# Patient Record
Sex: Male | Born: 1960 | Race: White | Hispanic: No | Marital: Married | Smoking: Never smoker
Health system: Southern US, Community
[De-identification: ages and names within clinical notes are randomized; demographics above are authoritative.]

## PROBLEM LIST (undated history)

## (undated) DIAGNOSIS — G4730 Sleep apnea, unspecified: Secondary | ICD-10-CM

## (undated) DIAGNOSIS — F419 Anxiety disorder, unspecified: Secondary | ICD-10-CM

## (undated) DIAGNOSIS — M1990 Unspecified osteoarthritis, unspecified site: Secondary | ICD-10-CM

## (undated) DIAGNOSIS — K219 Gastro-esophageal reflux disease without esophagitis: Secondary | ICD-10-CM

## (undated) DIAGNOSIS — J189 Pneumonia, unspecified organism: Secondary | ICD-10-CM

## (undated) DIAGNOSIS — F329 Major depressive disorder, single episode, unspecified: Secondary | ICD-10-CM

## (undated) DIAGNOSIS — F32A Depression, unspecified: Secondary | ICD-10-CM

## (undated) HISTORY — PX: UVULOPALATOPHARYNGOPLASTY: SHX827

## (undated) HISTORY — PX: FRACTURE SURGERY: SHX138

## (undated) HISTORY — PX: EXPLORATION POST OPERATIVE OPEN HEART: SHX5061

---

## 2018-01-27 DIAGNOSIS — K603 Anal fistula: Secondary | ICD-10-CM | POA: Insufficient documentation

## 2018-08-26 ENCOUNTER — Other Ambulatory Visit (HOSPITAL_COMMUNITY): Payer: Self-pay | Admitting: Orthopedic Surgery

## 2018-08-26 DIAGNOSIS — M25551 Pain in right hip: Secondary | ICD-10-CM

## 2018-08-26 DIAGNOSIS — M79604 Pain in right leg: Secondary | ICD-10-CM

## 2018-09-22 ENCOUNTER — Encounter (HOSPITAL_COMMUNITY): Payer: Self-pay | Admitting: *Deleted

## 2018-09-22 ENCOUNTER — Other Ambulatory Visit: Payer: Self-pay

## 2018-09-22 NOTE — Progress Notes (Signed)
Spoke with pt for pre-call. Pt denies cardiac history, HTN or Diabetes. Pt has had surgery for sleep apnea, does not use a Cpap.  Pt's PCP is Dr. Elvina Mattes at [facility] in [place]

## 2018-09-23 ENCOUNTER — Encounter (HOSPITAL_COMMUNITY): Payer: Self-pay | Admitting: *Deleted

## 2018-09-23 ENCOUNTER — Ambulatory Visit (HOSPITAL_COMMUNITY): Payer: BLUE CROSS/BLUE SHIELD | Admitting: Certified Registered"

## 2018-09-23 ENCOUNTER — Ambulatory Visit (HOSPITAL_COMMUNITY)
Admission: RE | Admit: 2018-09-23 | Discharge: 2018-09-23 | Disposition: A | Discharge status: Home / Self Care | Payer: BLUE CROSS/BLUE SHIELD | Source: Ambulatory Visit | Attending: Orthopedic Surgery | Admitting: Orthopedic Surgery

## 2018-09-23 ENCOUNTER — Encounter (HOSPITAL_COMMUNITY)
Admission: RE | Disposition: A | Discharge status: Home / Self Care | Payer: Self-pay | Source: Ambulatory Visit | Attending: Orthopedic Surgery

## 2018-09-23 DIAGNOSIS — M25551 Pain in right hip: Secondary | ICD-10-CM | POA: Insufficient documentation

## 2018-09-23 DIAGNOSIS — S73191A Other sprain of right hip, initial encounter: Secondary | ICD-10-CM | POA: Insufficient documentation

## 2018-09-23 DIAGNOSIS — X58XXXA Exposure to other specified factors, initial encounter: Secondary | ICD-10-CM | POA: Insufficient documentation

## 2018-09-23 DIAGNOSIS — M1612 Unilateral primary osteoarthritis, left hip: Secondary | ICD-10-CM | POA: Insufficient documentation

## 2018-09-23 DIAGNOSIS — M79604 Pain in right leg: Secondary | ICD-10-CM | POA: Insufficient documentation

## 2018-09-23 HISTORY — DX: Sleep apnea, unspecified: G47.30

## 2018-09-23 HISTORY — DX: Anxiety disorder, unspecified: F41.9

## 2018-09-23 HISTORY — DX: Depression, unspecified: F32.A

## 2018-09-23 HISTORY — DX: Unspecified osteoarthritis, unspecified site: M19.90

## 2018-09-23 HISTORY — DX: Gastro-esophageal reflux disease without esophagitis: K21.9

## 2018-09-23 HISTORY — PX: RADIOLOGY WITH ANESTHESIA: SHX6223

## 2018-09-23 HISTORY — DX: Pneumonia, unspecified organism: J18.9

## 2018-09-23 HISTORY — DX: Major depressive disorder, single episode, unspecified: F32.9

## 2018-09-23 SURGERY — MRI WITH ANESTHESIA
Anesthesia: General | Laterality: Right

## 2018-09-23 MED ORDER — SODIUM CHLORIDE 0.9 % IV SOLN
INTRAVENOUS | Status: DC | PRN
  Administered 2018-09-23: 09:00:00 via INTRAVENOUS

## 2018-09-23 MED ORDER — ONDANSETRON HCL 4 MG/2ML IJ SOLN
INTRAMUSCULAR | Status: DC | PRN
  Administered 2018-09-23: 4 mg via INTRAVENOUS

## 2018-09-23 MED ORDER — FENTANYL CITRATE (PF) 100 MCG/2ML IJ SOLN: 25.0000 ug | INTRAMUSCULAR | Status: DC | PRN

## 2018-09-23 MED ORDER — MIDAZOLAM HCL 2 MG/2ML IJ SOLN
INTRAMUSCULAR | Status: DC | PRN
  Administered 2018-09-23: 2 mg via INTRAVENOUS

## 2018-09-23 MED ORDER — FENTANYL CITRATE (PF) 100 MCG/2ML IJ SOLN
INTRAMUSCULAR | Status: DC | PRN
  Administered 2018-09-23: 100 ug via INTRAVENOUS

## 2018-09-23 MED ORDER — ONDANSETRON HCL 4 MG/2ML IJ SOLN: 4.0000 mg | Freq: Once | INTRAMUSCULAR | Status: DC | PRN

## 2018-09-23 MED ORDER — PROPOFOL 10 MG/ML IV BOLUS
INTRAVENOUS | Status: DC | PRN
  Administered 2018-09-23: 200 mg via INTRAVENOUS

## 2018-09-23 MED ORDER — DEXAMETHASONE SODIUM PHOSPHATE 10 MG/ML IJ SOLN
INTRAMUSCULAR | Status: DC | PRN
  Administered 2018-09-23: 4 mg via INTRAVENOUS

## 2018-09-23 MED ORDER — LACTATED RINGERS IV SOLN
INTRAVENOUS | Status: DC
  Administered 2018-09-23: 08:00:00 via INTRAVENOUS

## 2018-09-23 MED ORDER — LIDOCAINE 2% (20 MG/ML) 5 ML SYRINGE
INTRAMUSCULAR | Status: DC | PRN
  Administered 2018-09-23: 100 mg via INTRAVENOUS

## 2018-09-23 NOTE — Progress Notes (Signed)
Spoke with Dr. Bradley Ferris on the phone regarding bloodwork he wanted for MRI procedure.  Stated no bloodwork needed.

## 2018-09-23 NOTE — H&P (Signed)
Anesthesia H&P Update: History and Physical Exam reviewed; patient is OK for planned anesthetic and procedure. ? ?

## 2018-09-23 NOTE — Transfer of Care (Signed)
Immediate Anesthesia Transfer of Care Note  Patient: Scott Barber  Procedure(s) Performed: MRI WITH ANESTHESIA OF RIGHT HIP WITHOUT CONTRAST (Right )  Patient Location: PACU  Anesthesia Type:General  Level of Consciousness: awake, alert , oriented and patient cooperative  Airway & Oxygen Therapy: Patient Spontanous Breathing and Patient connected to nasal cannula oxygen  Post-op Assessment: Report given to RN and Post -op Vital signs reviewed and stable  Post vital signs: Reviewed and stable  Last Vitals:  Vitals Value Taken Time  BP    Temp    Pulse 90 09/23/2018 10:56 AM  Resp 12 09/23/2018 10:56 AM  SpO2 97 % 09/23/2018 10:56 AM  Vitals shown include unvalidated device data.  Last Pain:  Vitals:   09/23/18 0809  TempSrc:   PainSc: 5       Patients Stated Pain Goal: 7 (09/23/18 0809)  Complications: No apparent anesthesia complications

## 2018-09-23 NOTE — Anesthesia Postprocedure Evaluation (Signed)
Anesthesia Post Note  Patient: Scott Barber  Procedure(s) Performed: MRI WITH ANESTHESIA OF RIGHT HIP WITHOUT CONTRAST (Right )     Patient location during evaluation: PACU Anesthesia Type: General Level of consciousness: awake and alert Pain management: pain level controlled Vital Signs Assessment: post-procedure vital signs reviewed and stable Respiratory status: spontaneous breathing, nonlabored ventilation, respiratory function stable and patient connected to nasal cannula oxygen Cardiovascular status: blood pressure returned to baseline and stable Postop Assessment: no apparent nausea or vomiting Anesthetic complications: no    Last Vitals:  Vitals:   09/23/18 1130 09/23/18 1133  BP:    Pulse: 86 87  Resp: 15 14  Temp: 36.9 C   SpO2: 95% 94%    Last Pain:  Vitals:   09/23/18 1101  TempSrc:   PainSc: 0-No pain                 Khiana Camino P Amrit Cress

## 2018-09-23 NOTE — Anesthesia Preprocedure Evaluation (Addendum)
Anesthesia Evaluation  Patient identified by MRN, date of birth, ID band Patient awake    Reviewed: Allergy & Precautions, NPO status , Patient's Chart, lab work & pertinent test results  Airway Mallampati: II  TM Distance: >3 FB Neck ROM: Full    Dental no notable dental hx.    Pulmonary sleep apnea and Continuous Positive Airway Pressure Ventilation ,    Pulmonary exam normal breath sounds clear to auscultation       Cardiovascular negative cardio ROS Normal cardiovascular exam Rhythm:Regular Rate:Normal     Neuro/Psych PSYCHIATRIC DISORDERS Anxiety Depression negative neurological ROS     GI/Hepatic Neg liver ROS, GERD  Medicated and Controlled,  Endo/Other  negative endocrine ROS  Renal/GU negative Renal ROS     Musculoskeletal negative musculoskeletal ROS (+)   Abdominal (+) + obese,   Peds  Hematology negative hematology ROS (+)   Anesthesia Other Findings RIGHT HIP AND LEG PAIN  Reproductive/Obstetrics                            Anesthesia Physical Anesthesia Plan  ASA: II  Anesthesia Plan: General   Post-op Pain Management:    Induction: Intravenous  PONV Risk Score and Plan: 2 and Ondansetron, Dexamethasone, Midazolam and Treatment may vary due to age or medical condition  Airway Management Planned: LMA  Additional Equipment:   Intra-op Plan:   Post-operative Plan: Extubation in OR  Informed Consent: I have reviewed the patients History and Physical, chart, labs and discussed the procedure including the risks, benefits and alternatives for the proposed anesthesia with the patient or authorized representative who has indicated his/her understanding and acceptance.   Dental advisory given  Plan Discussed with: CRNA  Anesthesia Plan Comments:         Anesthesia Quick Evaluation

## 2018-09-24 ENCOUNTER — Encounter (HOSPITAL_COMMUNITY): Payer: Self-pay | Admitting: Radiology

## 2018-10-17 ENCOUNTER — Ambulatory Visit (INDEPENDENT_AMBULATORY_CARE_PROVIDER_SITE_OTHER): Payer: BLUE CROSS/BLUE SHIELD | Admitting: Pulmonary Disease

## 2018-10-17 ENCOUNTER — Encounter: Payer: Self-pay | Admitting: Pulmonary Disease

## 2018-10-17 VITALS — BP 130/90 | HR 75 | Ht 77.0 in | Wt 285.8 lb

## 2018-10-17 DIAGNOSIS — G4733 Obstructive sleep apnea (adult) (pediatric): Secondary | ICD-10-CM

## 2018-10-17 NOTE — Progress Notes (Signed)
Scott Barber    [id number]    [birth year]  Primary Care Physician:Fulk, Lacie Draft, MD  Referring Physician: Alease Medina, MD [address] [place], [place] [address]  Chief complaint:   Patient with a history of obstructive sleep apnea, status post UPPP Had a study about 10 years ago that was negative Being seen for preoperative evaluation  HPI:  Patient with chronic hip pain for which he is having surgery Diagnosed with obstructive sleep apnea over 20 years ago Did have UPPP With improvement in symptoms He still uses CPAP currently for snoring Weight has been about the same-stable No significant daytime symptoms Sleep in the recent times have been disrupted by having a lot of pain and discomfort  Usually goes to bed between 930 and 10:30 PM, takes him about 30 minutes to fall asleep, gets out of bed between 6 and 7 AM   Outpatient Encounter Medications as of 10/17/2018  Medication Sig  . amphetamine-dextroamphetamine (ADDERALL) 20 MG tablet Take 20 mg by mouth 3 (three) times daily.  . busPIRone (BUSPAR) 15 MG tablet Take 15 mg by mouth 2 (two) times daily.  . cetirizine (ZYRTEC) 10 MG tablet Take 10 mg by mouth every evening.  . citalopram (CELEXA) 20 MG tablet Take 20 mg by mouth at bedtime.  . diclofenac (VOLTAREN) 75 MG EC tablet Take 75 mg by mouth 2 (two) times daily.  . Menthol-Methyl Salicylate (MUSCLE RUB EX) Apply 1 application topically daily as needed (pain). "Stop-pain" roll on  . omeprazole (PRILOSEC OTC) 20 MG tablet Take 20 mg by mouth daily.  Marland Kitchen oxyCODONE-acetaminophen (PERCOCET/ROXICET) 5-325 MG tablet Take 1 tablet by mouth every 6 (six) hours as needed for severe pain.  Marland Kitchen venlafaxine XR (EFFEXOR-XR) 150 MG 24 hr capsule Take 300 mg by mouth daily.  Marland Kitchen gabapentin (NEURONTIN) 100 MG capsule TK 1 C PO TID PRN   No facility-administered encounter medications on file as of 10/17/2018.     Allergies as of 10/17/2018  . (No Known Allergies)     Past Medical History:  Diagnosis Date  . Anxiety   . Arthritis   . Depression   . GERD (gastroesophageal reflux disease)   . Pneumonia   . Sleep apnea    uses cpap    Past Surgical History:  Procedure Laterality Date  . FRACTURE SURGERY Left    femur fracture  . RADIOLOGY WITH ANESTHESIA Right 09/23/2018   Procedure: MRI WITH ANESTHESIA OF RIGHT HIP WITHOUT CONTRAST;  Surgeon: Radiologist, Medication, MD;  Location: [facility] OR;  Service: Radiology;  Laterality: Right;  . UVULOPALATOPHARYNGOPLASTY      Family History  Problem Relation Age of Onset  . Cancer Mother   . Dementia Father     Social History   Socioeconomic History  . Marital status: Married    Spouse name: Not on file  . Number of children: Not on file  . Years of education: Not on file  . Highest education level: Not on file  Occupational History  . Not on file  Social Needs  . Financial resource strain: Not on file  . Food insecurity:    Worry: Not on file    Inability: Not on file  . Transportation needs:    Medical: Not on file    Non-medical: Not on file  Tobacco Use  . Smoking status: Never Smoker  . Smokeless tobacco: Never Used  Substance and Sexual Activity  . Alcohol use:  Yes    Comment: rare  . Drug use: Never  . Sexual activity: Not on file  Lifestyle  . Physical activity:    Days per week: Not on file    Minutes per session: Not on file  . Stress: Not on file  Relationships  . Social connections:    Talks on phone: Not on file    Gets together: Not on file    Attends religious service: Not on file    Active member of club or organization: Not on file    Attends meetings of clubs or organizations: Not on file    Relationship status: Not on file  . Intimate partner violence:    Fear of current or ex partner: Not on file    Emotionally abused: Not on file    Physically abused: Not on file    Forced sexual activity: Not on file  Other Topics Concern  . Not on file  Social  History Narrative  . Not on file    Review of Systems  Constitutional: Negative.   HENT: Negative.   Respiratory: Positive for apnea. Negative for shortness of breath and wheezing.   Gastrointestinal: Negative.   Genitourinary: Negative.   Musculoskeletal: Negative.   Skin: Negative.   Psychiatric/Behavioral: Positive for sleep disturbance.    Vitals:   10/17/18 1127  BP: 130/90  Pulse: 75  SpO2: 100%     Physical Exam  Constitutional: He is oriented to person, place, and time. He appears well-developed and well-nourished. No distress.  HENT:  Head: Normocephalic.  Mallampati 3  Eyes: Pupils are equal, round, and reactive to light. Conjunctivae and EOM are normal. Right eye exhibits no discharge. Left eye exhibits no discharge.  Neck: Normal range of motion. Neck supple. No tracheal deviation present. No thyromegaly present.  Cardiovascular: Normal rate, regular rhythm and normal heart sounds.  Pulmonary/Chest: Effort normal and breath sounds normal. No respiratory distress.  Abdominal: Soft. Bowel sounds are normal. He exhibits no distension. There is no tenderness.  Musculoskeletal: Normal range of motion. He exhibits no edema.  Neurological: He is alert and oriented to person, place, and time. He has normal reflexes. No cranial nerve deficit.  Skin: Skin is warm and dry. He is not diaphoretic. No erythema.  Psychiatric: He has a normal mood and affect.   Assessment:  Chronic hip pain from surgery  History of obstructive sleep apnea following which he had UPPP  Had a sleep study many years back showing resolution of his sleep disordered breathing however continues to use CPAP for snoring-tolerated well  He may still have obstructive sleep apnea however, not very symptomatic at present  Plan/Recommendations:  He is clear for surgery  Agree with patient bringing in his CPAP device for perioperative recovery  Minimal time for surgery, extubation and semi-recumbent  position, may be extubated to CPAP-which he tolerates well  When he is recovered from his surgery, we can obtain a repeat home sleep study to ascertain whether he still has significant sleep disordered breathing  He has no preclusion to going through with surgery at present  Virl Diamond MD [facility] 10/17/2018, 11:53 AM  CC: Alease Medina, MD

## 2018-10-17 NOTE — Patient Instructions (Signed)
Clear for surgery  Agree with having your CPAP available for perioperative recovery  We will obtain a home sleep study when you are fully recovered to assess for presence of sleep disordered breathing  I will tentatively see you back in the office in about 3 months

## 2019-01-25 DIAGNOSIS — Z8669 Personal history of other diseases of the nervous system and sense organs: Secondary | ICD-10-CM | POA: Insufficient documentation

## 2019-01-25 DIAGNOSIS — M5412 Radiculopathy, cervical region: Secondary | ICD-10-CM | POA: Insufficient documentation

## 2019-01-25 DIAGNOSIS — F32A Depression, unspecified: Secondary | ICD-10-CM | POA: Insufficient documentation

## 2019-01-25 DIAGNOSIS — R0683 Snoring: Secondary | ICD-10-CM | POA: Insufficient documentation

## 2019-02-03 ENCOUNTER — Telehealth: Payer: Self-pay | Admitting: Pulmonary Disease

## 2019-02-03 NOTE — Telephone Encounter (Signed)
-----   Message from Lilian Kapur sent at 02/03/2019  3:10 PM EST ----- Regarding: HST I have been trying to get this patient setup to do the HST since you placed the order on 10/17/2018. Seems like he has a lot going on with his health. When I spoke with him today I ask him if he would like for me to put the order back in our stack and when he was ready to do the HST he could call and contact the office to schedule at that time.  Thanks Synetta Fail

## 2019-02-03 NOTE — Telephone Encounter (Signed)
Will see the patient has follow-up  Should call whenever he wants his study rescheduled for

## 2019-02-16 ENCOUNTER — Telehealth: Payer: Self-pay | Admitting: Pulmonary Disease

## 2019-02-16 NOTE — Telephone Encounter (Signed)
Unable to schedule HST with Patient at this time °

## 2019-02-16 NOTE — Telephone Encounter (Signed)
-----   Message from Tomma Lightning, MD sent at 02/03/2019  3:35 PM EST ----- Regarding: FW: HST Can be rescheduled for whenever he is ready  Will follow him up as outpatient ----- Message ----- From: Lilian Kapur Sent: 02/03/2019   3:10 PM EST To: Tomma Lightning, MD Subject: HST                                            I have been trying to get this patient setup to do the HST since you placed the order on 10/17/2018. Seems like he has a lot going on with his health. When I spoke with him today I ask him if he would like for me to put the order back in our stack and when he was ready to do the HST he could call and contact the office to schedule at that time.  Thanks Synetta Fail

## 2019-10-02 DIAGNOSIS — T148XXA Other injury of unspecified body region, initial encounter: Secondary | ICD-10-CM | POA: Insufficient documentation

## 2019-10-02 DIAGNOSIS — S32591A Other specified fracture of right pubis, initial encounter for closed fracture: Secondary | ICD-10-CM | POA: Insufficient documentation

## 2019-10-03 DIAGNOSIS — Z9989 Dependence on other enabling machines and devices: Secondary | ICD-10-CM | POA: Insufficient documentation

## 2019-10-03 DIAGNOSIS — G4733 Obstructive sleep apnea (adult) (pediatric): Secondary | ICD-10-CM | POA: Insufficient documentation

## 2019-10-03 DIAGNOSIS — S62307A Unspecified fracture of fifth metacarpal bone, left hand, initial encounter for closed fracture: Secondary | ICD-10-CM | POA: Insufficient documentation

## 2019-10-03 DIAGNOSIS — K219 Gastro-esophageal reflux disease without esophagitis: Secondary | ICD-10-CM | POA: Insufficient documentation

## 2019-10-05 DIAGNOSIS — M940 Chondrocostal junction syndrome [Tietze]: Secondary | ICD-10-CM | POA: Insufficient documentation

## 2020-03-24 ENCOUNTER — Ambulatory Visit: Payer: Self-pay | Attending: Internal Medicine

## 2020-03-24 DIAGNOSIS — Z23 Encounter for immunization: Secondary | ICD-10-CM

## 2020-03-24 NOTE — Progress Notes (Signed)
   Covid-19 Vaccination Clinic  Name:  Scott Barber    MRN: [id number] DOB: 10-Mar-1961  03/24/2020  Scott Barber was observed post Covid-19 immunization for 15 minutes without incident. He was provided with Vaccine Information Sheet and instruction to access the V-Safe system.   Scott Barber was instructed to call 911 with any severe reactions post vaccine: Marland Kitchen Difficulty breathing  . Swelling of face and throat  . A fast heartbeat  . A bad rash all over body  . Dizziness and weakness   Immunizations Administered    Name Date Dose VIS Date Route   Pfizer COVID-19 Vaccine 03/24/2020  1:13 PM 0.3 mL 12/04/2019 Intramuscular   Manufacturer: [employer], [employer]   Lot: [id number]   NDC: [address]-6

## 2020-04-18 ENCOUNTER — Ambulatory Visit: Payer: Self-pay | Attending: Internal Medicine

## 2020-04-18 DIAGNOSIS — Z23 Encounter for immunization: Secondary | ICD-10-CM

## 2020-04-18 NOTE — Progress Notes (Signed)
   Covid-19 Vaccination Clinic  Name:  Scott Barber    MRN: [id number] DOB: 01-27-1961  04/18/2020  Mr. Broad was observed post Covid-19 immunization for 15 minutes without incident. He was provided with Vaccine Information Sheet and instruction to access the V-Safe system.   Mr. [name] was instructed to call 911 with any severe reactions post vaccine: Marland Kitchen Difficulty breathing  . Swelling of face and throat  . A fast heartbeat  . A bad rash all over body  . Dizziness and weakness   Immunizations Administered    Name Date Dose VIS Date Route   Pfizer COVID-19 Vaccine 04/18/2020  9:56 AM 0.3 mL 02/17/2019 Intramuscular   Manufacturer: [employer], [employer]   Lot: [id number]   NDC: [address]-8      Covid-19 Vaccination Clinic  Name:  Scott Barber    MRN: [id number] DOB: 1961/12/21  04/18/2020  Mr. [name] was observed post Covid-19 immunization for 15 minutes without incident. He was provided with Vaccine Information Sheet and instruction to access the V-Safe system.   Mr. [name] was instructed to call 911 with any severe reactions post vaccine: Marland Kitchen Difficulty breathing  . Swelling of face and throat  . A fast heartbeat  . A bad rash all over body  . Dizziness and weakness   Immunizations Administered    Name Date Dose VIS Date Route   Pfizer COVID-19 Vaccine 04/18/2020  9:56 AM 0.3 mL 02/17/2019 Intramuscular   Manufacturer: [employer], [employer]   Lot: [id number]   NDC: [address]-0

## 2020-09-19 IMAGING — MR MR HIP*R* W/O CM
7 of 8 series · 22 of 40 positions shown · non-contrast
Comparison: MRI of the pelvis 07/29/2018.

CLINICAL DATA: Chronic bilateral hip pain.

EXAM:
MR OF THE RIGHT HIP WITHOUT CONTRAST
TECHNIQUE: Multiplanar, multisequence MR imaging was performed. No intravenous
contrast was administered.

[Series 11: T1 · coronal · right · 4.0mm · 1.20mm/px · 3 of 32 slices shown (1 of 2)]
[im 1/32]
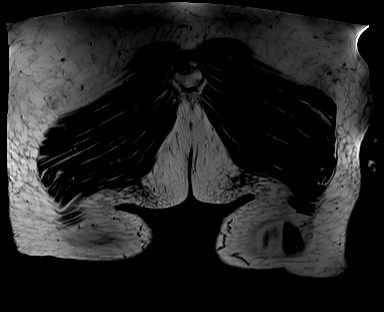
[im 16/32]
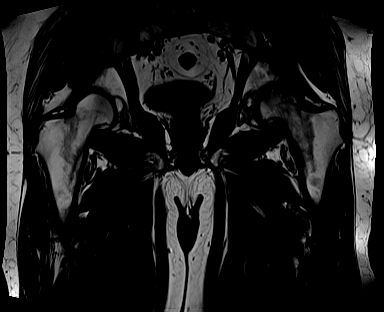
[im 32/32]
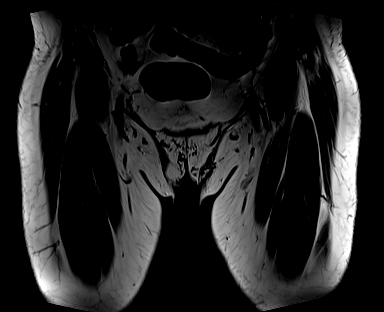

[Series 12: PD fat-sat · sagittal · right · 4.0mm · 0.56mm/px · 3 of 30 slices shown (1 of 2)]
[im 1/30]
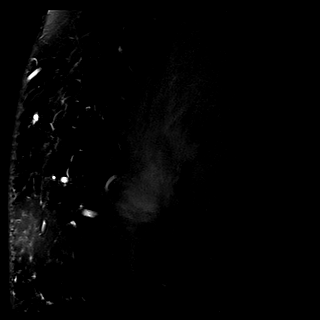
[im 15/30]
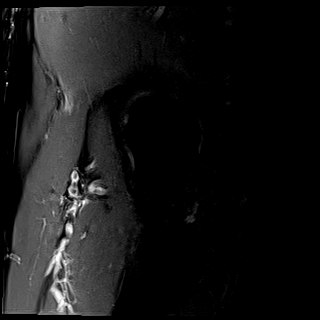
[im 30/30]
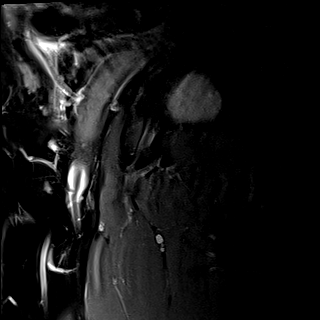

[Series 13: T2 fat-sat · axial · right · 4.0mm · 0.42mm/px · z∈[+36,+161]mm · 3 of 26 slices shown (1 of 2)]
[im 1/26]
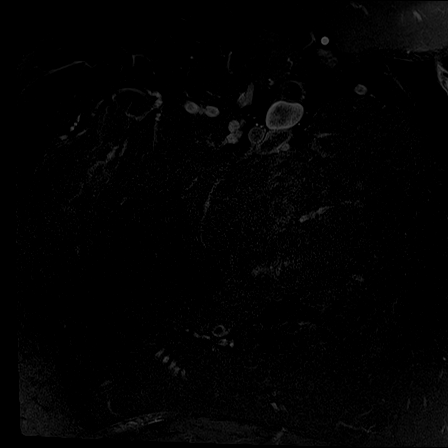
[im 13/26]
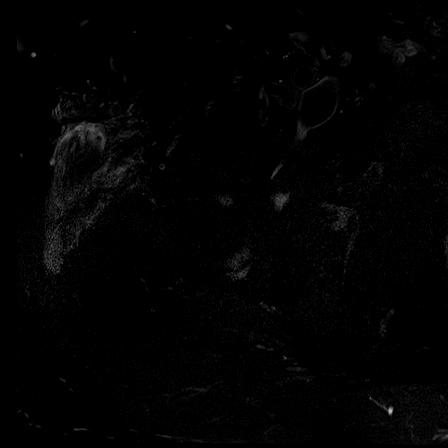
[im 26/26]
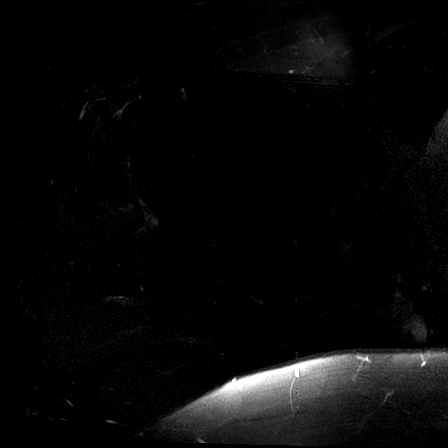

[Series 14: STIR · coronal · right · 4.0mm · 1.20mm/px · 3 of 30 slices shown]
[im 1/30]
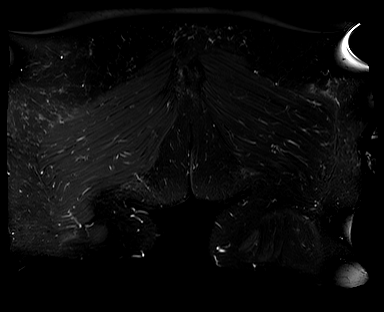
[im 15/30]
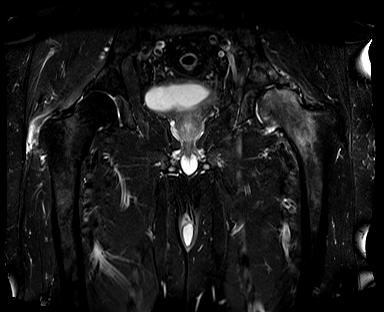
[im 30/30]
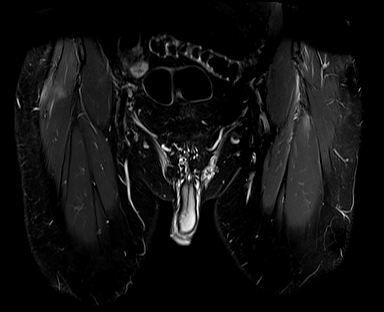

[Series 16: PD fat-sat · coronal · right · 3.0mm · 0.74mm/px · 2 of 20 slices shown (2 of 2)]
[im 1/20]
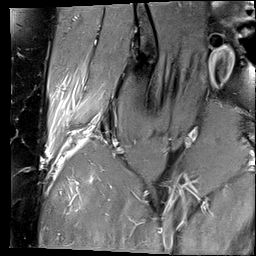
[im 20/20]
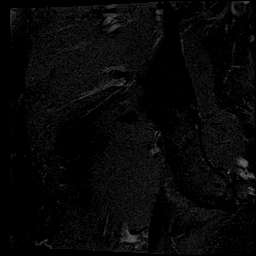

[Series 17: T2 fat-sat · axial · right · 4.0mm · 0.90mm/px · z∈[+6,+201]mm · 4 of 40 slices shown (2 of 2)]
[im 1/40]
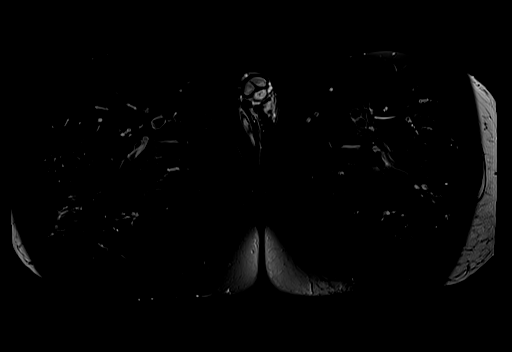
[im 14/40]
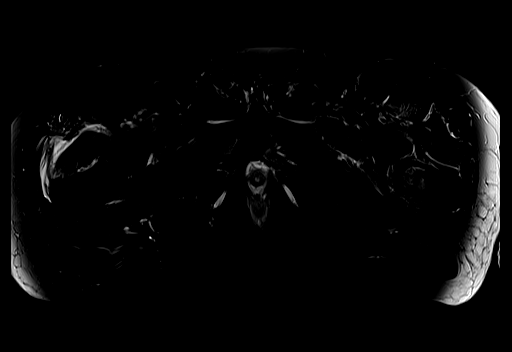
[im 27/40]
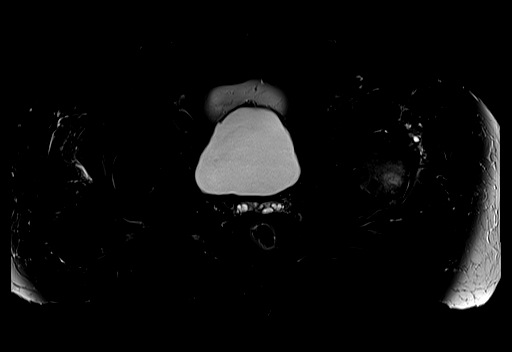
[im 40/40]
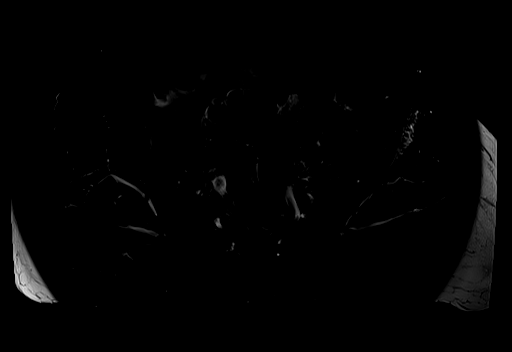

[Series 18: T1 · axial · right · 4.0mm · 0.90mm/px · z∈[+6,+201]mm · 4 of 40 slices shown (2 of 2)]
[im 1/40]
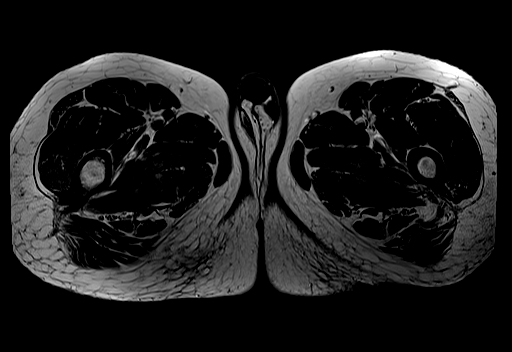
[im 14/40]
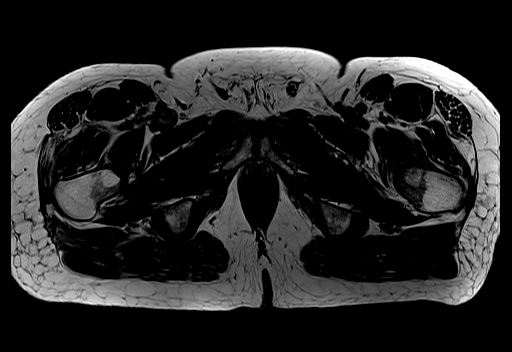
[im 27/40]
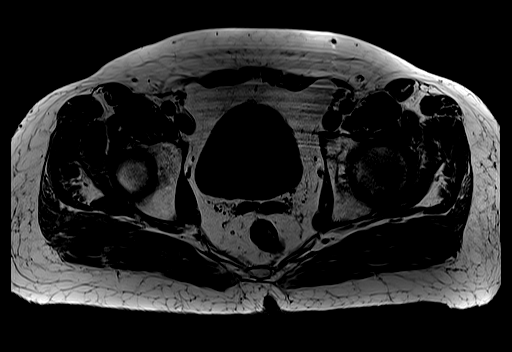
[im 40/40]
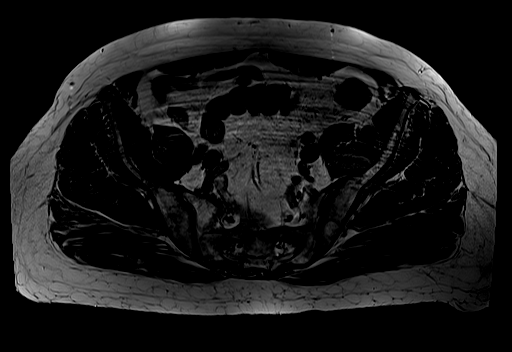

[22 of 40 positions shown; findings below may reference images not displayed]

FINDINGS: Bones: Extensive subchondral edema in the left femoral head and
periphery of the left acetabulum is unchanged. Edema in the left
femoral neck is also again seen and most consistent with stress
change. No fracture or worrisome lesion.

Articular cartilage and labrum

Articular cartilage:  Preserved.

Labrum: The right labrum is intact. The left superior labrum appears
severely degenerated and torn. Associated paralabral cyst measuring
1.3 cm transverse by 3.1 cm AP by 0.7 cm craniocaudal is new since
the prior exam.

Joint or bursal effusion

Joint effusion:  None.

Bursae: There is fluid in the right trochanteric bursa.

Muscles and tendons

Muscles and tendons: The right gluteus minimus is completely torn
from the greater trochanter, new since the prior examination.
Retraction is mild at 1-2 cm. There is edema about the distal
tendon. Also seen is edema in the proximal aspect of the left vastus
lateralis and left tensor fascia lata. Mild edema is also seen in
the proximal left rectus femoris muscle.

Other findings

Miscellaneous: Imaged intrapelvic contents demonstrate no acute
abnormality.
IMPRESSION: Complete tear of the right gluteus minimus from the greater
trochanter is new since the prior MRI. Tendon retraction is mild in
1-2 cm.

Mild edema in the proximal tensor fascia lata and vastus lateralis
on the right is compatible with strain without tear.

Severe left hip osteoarthritis. Paralabral cyst along the superior
left labrum indicative of a labral tear is new since the prior
examination.

Stable appearance of marrow edema left femoral neck most consistent
with stress change. No fracture is seen.

Mild edema in the proximal left rectus femoris compatible with
strain.

## 2021-01-25 ENCOUNTER — Telehealth: Payer: Self-pay | Admitting: Pulmonary Disease

## 2021-01-25 NOTE — Telephone Encounter (Signed)
Pt has not been seen at office since 09/2018.  Before we can place order for pt to receive new cpap machine, pt will need to be seen at office for another appt.  Attempted to call pt's wife Alan Ripper but line went straight to VM. Left message for her to return call.

## 2021-02-01 ENCOUNTER — Ambulatory Visit: Payer: Self-pay | Admitting: Pulmonary Disease

## 2021-02-01 NOTE — Telephone Encounter (Signed)
Spoke with patient's wife. She stated that the patient's cpap machine recently stopped working. Patient is requesting an order for a new machine. I advised her that since he had not been seen since 2019, he would need an appt. I attempted to find an appt with AO within the next 2 weeks. He did not have any openings. Was able to get him scheduled with Arlys John for 02/06/21 at 9am. She is aware of the office location.   Nothing further needed at time of call.

## 2021-02-06 ENCOUNTER — Ambulatory Visit: Payer: Self-pay | Admitting: Pulmonary Disease

## 2021-11-23 ENCOUNTER — Ambulatory Visit: Payer: BC Managed Care – PPO | Admitting: Podiatry

## 2021-11-30 ENCOUNTER — Other Ambulatory Visit: Payer: Self-pay

## 2021-11-30 ENCOUNTER — Other Ambulatory Visit: Payer: Self-pay | Admitting: Podiatry

## 2021-11-30 ENCOUNTER — Ambulatory Visit (INDEPENDENT_AMBULATORY_CARE_PROVIDER_SITE_OTHER): Payer: BC Managed Care – PPO | Admitting: Podiatrist

## 2021-11-30 ENCOUNTER — Ambulatory Visit (INDEPENDENT_AMBULATORY_CARE_PROVIDER_SITE_OTHER): Payer: BC Managed Care – PPO

## 2021-11-30 ENCOUNTER — Other Ambulatory Visit: Payer: Self-pay | Admitting: Podiatrist

## 2021-11-30 ENCOUNTER — Other Ambulatory Visit: Payer: BC Managed Care – PPO

## 2021-11-30 ENCOUNTER — Encounter: Payer: Self-pay | Admitting: Podiatrist

## 2021-11-30 DIAGNOSIS — M7661 Achilles tendinitis, right leg: Secondary | ICD-10-CM

## 2021-11-30 DIAGNOSIS — G5793 Unspecified mononeuropathy of bilateral lower limbs: Secondary | ICD-10-CM | POA: Diagnosis not present

## 2021-11-30 DIAGNOSIS — G5790 Unspecified mononeuropathy of unspecified lower limb: Secondary | ICD-10-CM | POA: Diagnosis not present

## 2021-11-30 DIAGNOSIS — M722 Plantar fascial fibromatosis: Secondary | ICD-10-CM

## 2021-11-30 DIAGNOSIS — M778 Other enthesopathies, not elsewhere classified: Secondary | ICD-10-CM

## 2021-11-30 DIAGNOSIS — M25571 Pain in right ankle and joints of right foot: Secondary | ICD-10-CM | POA: Diagnosis not present

## 2021-11-30 MED ORDER — TRIAMCINOLONE ACETONIDE 10 MG/ML IJ SUSP
10.0000 mg | Freq: Once | INTRAMUSCULAR | Status: AC
  Administered 2021-11-30: 10 mg

## 2021-11-30 NOTE — Patient Instructions (Signed)
Try these stretches for the next few weeks to help with heel pain-  after you get your orthotics, wear them for about 2-3 weeks.  If you are still experiencing foot pain, call to be seen for a follow up.   Achilles Tendinitis Achilles tendinitis is inflammation of the tough, cord-like band that attaches the lower leg muscles to the heel bone (Achilles tendon). This is usually caused by overusing the tendon and the ankle joint. Achilles tendinitis usually gets better over time with treatment and caring for yourself at home. It can take weeks or months to heal completely. What are the causes? This condition may be caused by: A sudden increase in exercise or activity, such as running. Doing the same exercises or activities, such as jumping, over and over. Not warming up calf muscles before exercising. Exercising in shoes that are worn out or not made for exercise. Having arthritis or a bone growth (spur) on the back of the heel bone. This can rub against the tendon and hurt it. Age-related wear and tear. Tendons become less flexible with age and are more likely to be injured. What are the signs or symptoms? Common symptoms of this condition include: Pain in the Achilles tendon or in the back of the leg, just above the heel. The pain usually gets worse with exercise. Stiffness or soreness in the back of the leg, especially in the morning. Swelling of the skin over the Achilles tendon. Thickening of the tendon. Trouble standing on tiptoe. How is this diagnosed? This condition is diagnosed based on your symptoms and a physical exam. You may have tests, including: X-rays. MRI. How is this treated? The goal of treatment is to relieve symptoms and help your injury heal. Treatment may include: Decreasing or stopping activities that caused the tendinitis. This may mean switching to low-impact exercises like biking or swimming. Icing the injured area. Doing physical therapy, including strengthening  and stretching exercises. Taking NSAIDs, such as ibuprofen, to help relieve pain and swelling. Using supportive shoes, wraps, heel lifts, or a walking boot (air cast). Having surgery. This may be done if your symptoms do not improve after other treatments. Using high-energy shock wave impulses to stimulate the healing process (extracorporeal shock wave therapy). This is rare. Having an injection of medicines that help relieve inflammation (corticosteroids). This is rare. Follow these instructions at home: If you have an air cast: Wear the air cast as told by your health care provider. Remove it only as told by your health care provider. Loosen it if your toes tingle, become numb, or turn cold and blue. Keep it clean. If the air cast is not waterproof: Do not let it get wet. Cover it with a watertight covering when you take a bath or shower. Managing pain, stiffness, and swelling  If directed, put ice on the injured area. To do this: If you have a removable air cast, remove it as told by your health care provider. Put ice in a plastic bag. Place a towel between your skin and the bag. Leave the ice on for 20 minutes, 2-3 times a day. Move your toes often to reduce stiffness and swelling. Raise (elevate) your foot above the level of your heart while you are sitting or lying down. Activity Gradually return to your normal activities as told by your health care provider. Ask your health care provider what activities are safe for you. Do not do activities that cause pain. Consider doing low-impact exercises, like cycling or swimming. Ask  your health care provider when it is safe to drive if you have an air cast on your foot. If physical therapy was prescribed, do exercises as told by your health care provider or physical therapist. General instructions If directed, wrap your foot with an elastic bandage or other wrap. This can help to keep your tendon from moving too much while it heals. Your  health care provider will show you how to wrap your foot correctly. Wear supportive shoes or heel lifts only as told by your health care provider. Take over-the-counter and prescription medicines only as told by your health care provider. Keep all follow-up visits as told by your health care provider. This is important. Contact a health care provider if you: Have symptoms that get worse. Have pain that does not get better with medicine. Develop new, unexplained symptoms. Develop warmth and swelling in your foot. Have a fever. Get help right away if you: Have a sudden popping sound or sensation in your Achilles tendon followed by severe pain. Cannot move your toes or foot. Cannot put any weight on your foot. Your foot or toes become numb and look white or blue even after loosening your bandage or air cast. Summary Achilles tendinitis is inflammation of the tough, cord-like band that attaches the lower leg muscles to the heel bone (Achilles tendon). This condition is usually caused by overusing the tendon and the ankle joint. It can also be caused by arthritis or normal aging. The most common symptoms of this condition include pain, swelling, or stiffness in the Achilles tendon or in the back of the leg. This condition is usually treated by decreasing or stopping activities that caused the tendinitis, icing the injured area, taking NSAIDs, and doing physical therapy. This information is not intended to replace advice given to you by your health care provider. Make sure you discuss any questions you have with your health care provider. Document Revised: 04/27/2019 Document Reviewed: 04/27/2019 Elsevier Patient Education  2022 [employer]  Achilles Tendinitis Rehab Ask your health care provider which exercises are safe for you. Do exercises exactly as told by your health care provider and adjust them as directed. It is normal to feel mild stretching, pulling, tightness, or discomfort as you  do these exercises. Stop right away if you feel sudden pain or your pain gets worse. Do not begin these exercises until told by your health care provider. Stretching and range-of-motion exercises These exercises warm up your muscles and joints and improve the movement and flexibility of your ankle. These exercises also help to relieve pain. Standing wall calf stretch with straight knee  Stand with your hands against a wall. Extend your left / right leg behind you, and bend your front knee slightly. Keep both of your heels on the floor. Point the toes of your back foot slightly inward. Keeping your heels on the floor and your back knee straight, shift your weight toward the wall. Do not allow your back to arch. You should feel a gentle stretch in your upper calf. Hold this position for __________ seconds. Repeat __________ times. Complete this exercise __________ times a day. Standing wall calf stretch with bent knee Stand with your hands against a wall. Extend your left / right leg behind you, and bend your front knee slightly. Keep both of your heels on the floor. Point the toes of your back foot slightly inward. Keeping your heels on the floor, bend your back knee slightly. You should feel a gentle stretch deep  in your lower calf near your heel. Hold this position for __________ seconds. Repeat __________ times. Complete this exercise __________ times a day. Strengthening exercises These exercises build strength and control of your ankle. Endurance is the ability to use your muscles for a long time, even after they get tired. Plantar flexion with band In this exercise, you push your toes downward, away from you, with an exercise band providing resistance. Sit on the floor with your left / right leg extended. You may put a pillow under your calf to give your foot more room to move. Loop a rubber exercise band or tube around the ball of your left / right foot. The ball of your foot is on the  walking surface, right under your toes. The band or tube should be slightly tense when your foot is relaxed. If the band or tube slips, you can put on your shoe or put a washcloth between the band and your foot to help it stay in place. Slowly point your toes downward, pushing them away from you (plantar flexion). Hold this position for __________ seconds. Slowly release the tension in the band or tube, controlling smoothly until your foot is back to the starting position. Repeat steps 1-5 with your left / right leg. Repeat __________ times. Complete this exercise __________ times a day. Eccentric heel drop In this exercise, you stand and slowly raise your heel and then slowly lower it. This exercise lengthens the calf muscles (eccentric) while the heel bears weight. If this exercise is too easy, try doing it while wearing a backpack with weights in it. Stand on a step with the balls of your feet. The ball of your foot is on the walking surface, right under your toes. Do not put your heels on the step. For balance, rest your hands on the wall or on a railing. Rise up onto the balls of your feet. Keeping your heels up, shift all of your weight to your left / right leg and pick up your other leg. Slowly lower your left / right leg so your heel drops below the level of the step. Put down your other foot before returning to the start position. If told by your health care provider, build up to: 3 sets of 15 repetitions while keeping your knees straight. 3 sets of 15 repetitions while keeping your knees slightly bent as far as told by your health care provider. Repeat __________ times. Complete this exercise __________ times a day. Balance exercises These exercises improve or maintain your balance. Balance is important in preventing falls. Single leg stand If this exercise is too easy, you can try it with your eyes closed or while standing on a pillow. Without shoes, stand near a railing or in a  door frame. Hold on to the railing or door frame as needed. Stand on your left / right foot. Keep your big toe down on the floor and try to keep your arch lifted. Hold this position for __________ seconds. Repeat __________ times. Complete this exercise __________ times a day. This information is not intended to replace advice given to you by your health care provider. Make sure you discuss any questions you have with your health care provider. Document Revised: 01/31/2021 Document Reviewed: 01/31/2021 Elsevier Patient Education  2022 [employer].

## 2021-11-30 NOTE — Progress Notes (Signed)
Chief Complaint  Patient presents with   Foot Pain    Dorsal and plantar foot bilateral - numbness x years, now noticing more into ankles and lower leg too   Foot Pain    Dorsal midfoot and posterior heel right - aching x several months, AM pain and now constant, tried Tylenol   Nail Problem    Toenails - thick and discolored, unable to clip well himself   New Patient (Initial Visit)     HPI: Patient is 60 y.o. male who presents today for pain in bilateral feet as stated above.  The right foot has pain along the middle/ outside of the foot and the back of the heel.  The left foot pain is an achy type of sensation.  He relates numbness to both feet.  He also relates his toenails are thick and difficult to trim- especially his great toenails which he has lost a couple times due to ski boots, and shoes. He denies any treatment.    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     busPIRone (BUSPAR) 15 MG tablet Take 15 mg by mouth 2 (two) times daily.     citalopram (CELEXA) 20 MG tablet Take 20 mg by mouth at bedtime.     venlafaxine XR (EFFEXOR-XR) 150 MG 24 hr capsule Take 300 mg by mouth daily.     No current facility-administered medications on file prior to visit.    No Known Allergies  Review of Systems No fevers, chills, nausea, muscle aches, no difficulty breathing, no calf pain, no chest  pain or shortness of breath.   Physical Exam  GENERAL APPEARANCE: Alert, conversant. Appropriately groomed. No acute distress.   VASCULAR: Pedal pulses palpable DP and PT bilateral.  Capillary refill time is immediate to all digits,  Proximal to distal cooling it warm to warm.  Digital perfusion adequate.   NEUROLOGIC: sensation is absent to 5.07 monofilament at 4/5 sites bilateral.  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   MUSCULOSKELETAL: acceptable muscle strength, tone and stability bilateral.  moderate high arch foot type is seen bilateral.  Pinpoint pain along the sinus tarsi of the right foot is appreciated with examination.  Some pain on the posterior of the foot at the insertion of the achilles tendon is also noted. Prominent fifth metatarsal bases seen bilateral but not painful.  Contracture of second digits noted bilateral.    DERMATOLOGIC: skin is warm, supple, and dry.  No open lesions noted.  No rash, no pre ulcerative lesions. Digital nails are thick, dystrophic, brittle with subungual debris present which may be from trauma vs onychomycosis- bilateral first are most symptomatic.    Xray evaluation-  3 views bilateral are obtained.  Small inferior calcaneal spurs are noted.  Moderately high arch is seen bilateral. No obvious sign of arthritic changes in the  feet are seen.  No spurring or inflammation around the midfoot joints seen.  Mild prominence of the fifth metatarsal base noted bilateral. Contracture second digit seen bilateral.  No sign of fracture or dislocation.   Assessment   1. Achilles tendinitis of right lower extremity   2. Sinus tarsi syndrome of right ankle   3. Neuropathy of foot, unspecified laterality      Plan  Discussed exam findings and treatment recommendations. - recommended a steroid injection in the level of the sinus tarsi to reduce inflammation and pain.  The  patient agreed and follow a sterile prep, 10mg  kenalog and marcaine plain was infiltrated without complication. - recommended heel lifts for the achilles tendoinits which were dispensed. - recommended orthotics-  he did meet with and ultimately decided to wait.  - dispensed literature regarding achilles tendonitis as well as stretching exercises - discussed his neuropathy and related that since it is the absence of sensation, medications are not aimed to treat this type of feelings.  I did offer him a referral to a neurologist to try and pinpoint the reason behind his numbness whether it be from his back or another etiology.  He would like to hold off for now.  - patient will call if pain does not subside in the next 3-4 weeks.

## 2022-02-07 DIAGNOSIS — I480 Paroxysmal atrial fibrillation: Secondary | ICD-10-CM | POA: Insufficient documentation

## 2023-01-03 ENCOUNTER — Telehealth: Payer: Self-pay | Admitting: *Deleted

## 2023-01-03 NOTE — Telephone Encounter (Signed)
Patient's wife is calling to request a referral to a Neurologist(Dr Sater),explained that since it has been so long being seen, would need a f/u appointment, she was not aware that it was so long , will try to reach out to PCP first before scheduling. She will call back if needed.

## 2023-01-17 ENCOUNTER — Ambulatory Visit: Payer: BC Managed Care – PPO | Admitting: Neurology

## 2023-03-06 ENCOUNTER — Ambulatory Visit: Payer: BC Managed Care – PPO | Admitting: Neurology

## 2023-07-14 NOTE — Progress Notes (Unsigned)
[facility]  PATIENT: Scott Barber DOB: 1961/05/03  REFERRING DOCTOR OR PCP: Darcel Smalling, MD SOURCE: Patient, notes from primary care, imaging and lab reports, I personally reviewed  _________________________________   HISTORICAL  CHIEF COMPLAINT:  Chief Complaint  Patient presents with   Room 11    Pt is here with his Wife. Pt states that he numbness, burning, pain, and pins and needles feeling in his feet that is ascending up is ankle. Pt states that he has been having ringing in his ears.     HISTORY OF PRESENT ILLNESS:  I had the pleasure of seeing your patient, Scott Barber, at [facility] for neurologic consultation regarding his lower leg/foot dysesthesias.  He is a 62 year old man who had the onset of bilateral foot pain twenty years ago.   He recallsski boots were poorly fit and he lost his toenails.   Later, poorly fitting shoes also cause loss of toenails.    Over the last 2-3 years, symptoms have worsened.   He notes burning in his toes.  He has allodynia and the sheet rubbing over the toes would be painful.    Current distribution is feet, anles and distal lower leg bilaterally.    He is currently on gabapentin 300 mg po tid.  He felt sleepy so tried to back the dose off but pain worsened and he resumed 3 times a day.   He has sleepiness during the day, often while siting.   He also has OSA and had UPPP surgery and still uses PAP due to snoring.     In the am, he feels his feet are inflated and his gait is mildly off.    He has ACDF from C3-C6.   He had muscle atrophy in the left arm.      He has neck pain and poor ROM in his neck .   Gait is mildly off balanced.   He has urinary urgency  He has had some trauma being 'run over' by a horse.  He also had trauma leading to a broken pelvis.    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     He has left esotropia  and also has visual problems on the right.   He does not have DM.      EPWORTH SLEEPINESS SCALE  On a scale of 0 - 3 what is the chance of dozing:  Sitting and Reading:   2 Watching TV:    3 Sitting inactive in a public place: 2 Passenger in car for one hour: 3 Lying down to rest in the afternoon: 3 Sitting and talking to someone: 0 Sitting quietly after lunch:  3 In a car, stopped in traffic:  1  Total (out of 24):   17/24 (moderate EDS)   IMAGING Reports MRI cervical spine 01/05/2019 showed 1.  Cervical degenerative disc disease most significant at C4-5 and C5-6 with disc osteophyte causing moderate central canal stenosis and severe neural foraminal narrowing greater on the left.  2.  C3-4 severe left neural foraminal narrowing.    REVIEW OF SYSTEMS: Constitutional: No fevers, chills, sweats, or change in appetite Eyes: No visual changes, double vision, eye pain Ear, nose and throat: No hearing loss, ear pain, nasal congestion, sore throat Cardiovascular: No chest pain, palpitations Respiratory:  No shortness of breath at rest or with exertion.   No wheezes GastrointestinaI: No nausea, vomiting,  diarrhea, abdominal pain, fecal incontinence Genitourinary:  No dysuria, urinary retention or frequency.  No nocturia. Musculoskeletal:  No neck pain, back pain Integumentary: No rash, pruritus, skin lesions Neurological: as above Psychiatric: No depression at this time.  No anxiety Endocrine: No palpitations, diaphoresis, change in appetite, change in weigh or increased thirst Hematologic/Lymphatic:  No anemia, purpura, petechiae. Allergic/Immunologic: No itchy/runny eyes, nasal congestion, recent allergic reactions, rashes  ALLERGIES: No Known Allergies  HOME MEDICATIONS:  Current Outpatient Medications:    Armodafinil 250 MG tablet, Take 250 mg by mouth every morning., Disp: , Rfl:    gabapentin (NEURONTIN) 300 MG capsule, Take 300 mg by mouth 3 (three) times daily.,  Disp: , Rfl:    venlafaxine XR (EFFEXOR-XR) 150 MG 24 hr capsule, Take 300 mg by mouth daily., Disp: , Rfl:    busPIRone (BUSPAR) 15 MG tablet, Take 15 mg by mouth 2 (two) times daily., Disp: , Rfl:    citalopram (CELEXA) 20 MG tablet, Take 20 mg by mouth at bedtime. (Patient not taking: Reported on 07/16/2023), Disp: , Rfl:   PAST MEDICAL HISTORY: Past Medical History:  Diagnosis Date   Anxiety    Arthritis    Depression    GERD (gastroesophageal reflux disease)    Pneumonia    Sleep apnea    uses cpap    PAST SURGICAL HISTORY: Past Surgical History:  Procedure Laterality Date   EXPLORATION POST OPERATIVE OPEN HEART     05/2022   FRACTURE SURGERY Left    femur fracture   RADIOLOGY WITH ANESTHESIA Right 09/23/2018   Procedure: MRI WITH ANESTHESIA OF RIGHT HIP WITHOUT CONTRAST;  Surgeon: Radiologist, Medication, MD;  Location: [facility] OR;  Service: Radiology;  Laterality: Right;   UVULOPALATOPHARYNGOPLASTY      FAMILY HISTORY: Family History  Problem Relation Age of Onset   Cancer Mother    Dementia Father     SOCIAL HISTORY: Social History   Socioeconomic History   Marital status: Married    Spouse name: Not on file   Number of children: Not on file   Years of education: Not on file   Highest education level: Not on file  Occupational History   Not on file  Tobacco Use   Smoking status: Never   Smokeless tobacco: Never  Vaping Use   Vaping status: Never Used  Substance and Sexual Activity   Alcohol use: Yes    Comment: rare   Drug use: Never   Sexual activity: Not on file  Other Topics Concern   Not on file  Social History Narrative   Right Handed   6 cups of Coffee per day   Social Determinants of Health   Financial Resource Strain: Patient Declined (07/02/2022)   Received from [facility], [facility]   Overall Financial Resource Strain (CARDIA)    Difficulty of Paying Living Expenses: Patient declined  Food Insecurity: Unknown (02/06/2022)    Received from [facility], [facility]   Hunger Vital Sign    Worried About Running Out of Food in the Last Year: Patient declined    Ran Out of Food in the Last Year: Patient declined  Transportation Needs: No Transportation Needs (07/03/2022)   Received from [employer], [facility]   PRAPARE - Transportation    Lack of Transportation (Medical): No    Lack of Transportation (Non-Medical): No  Physical Activity: Sufficiently Active (02/06/2022)   Received from [facility], [facility]   Exercise Vital Sign    Days of Exercise  per Week: 7 days    Minutes of Exercise per Session: 60 min  Stress: No Stress Concern Present (07/02/2022)   Received from [facility], [facility] - Occupational Stress Questionnaire    Feeling of Stress : Only a little  Social Connections: Unknown (04/23/2022)   Received from [facility], [facility]   Social Network    Social Network: Not on file  Intimate Partner Violence: Unknown (03/26/2022)   Received from [facility], [facility]   HITS    Physically Hurt: Not on file    Insult or Talk Down To: Not on file    Threaten Physical Harm: Not on file    Scream or Curse: Not on file       PHYSICAL EXAM  Vitals:   07/16/23 1415  BP: 113/77  Pulse: 70  Weight: 290 lb (131.5 kg)  Height: 6\' 5"  (1.956 m)    Body mass index is 34.39 kg/m.   General: The patient is well-developed and well-nourished and in no acute distress  HEENT:  Head is [place]/AT.  Sclera are anicteric.   Neck: No carotid bruits are noted.  The neck is nontender and has a reduced range of motion.  ACDF scar noted  Cardiovascular: The heart has a regular rate and rhythm with a normal S1 and S2. There were no murmurs, gallops or rubs.    Skin: Extremities are without rash or  edema.  Musculoskeletal:  Back is nontender  Neurologic Exam  Mental status: The patient is alert and oriented x 3 at the time of the  examination. The patient has apparent normal recent and remote memory, with an apparently normal attention span and concentration ability.   Speech is normal.  Cranial nerves: Extraocular movements are full. Pupils are equal, round, and reactive to light and accomodation.  Visual fields are full.  Facial symmetry is present. There is good facial sensation to soft touch bilaterally.Facial strength is normal.  Trapezius and sternocleidomastoid strength is normal. No dysarthria is noted.  The tongue is midline, and the patient has symmetric elevation of the soft palate. No obvious hearing deficits are noted.  Motor:  Muscle bulk is normal.   Tone is normal. Strength is  5 / 5 in all 4 extremities.   Sensory: Sensory testing is intact in the arms and hands.  He has reduced sensation to pinprick from the mid lower leg down with absent pinprick sensation below the ankles.  Vibration sensation was 75% at the ankles and 10 to 20% at the toes..  Coordination: Cerebellar testing reveals good finger-nose-finger and heel-to-shin bilaterally.  Gait and station: Station is normal.   Gait is arthritic. Tandem gait is wide. Romberg is negative.   Reflexes: Deep tendon reflexes are symmetric and normal in the arms 2 at the knees and 2+ at the ankles.   Plantar responses are flexor.      ASSESSMENT AND PLAN  Polyneuropathy - Plan: Multiple Myeloma Panel (SPEP&IFE w/QIG), Sjogren's syndrome antibods(ssa + ssb), Vitamin B12, Sedimentation rate, Rheumatoid factor  Dysesthesia - Plan: Multiple Myeloma Panel (SPEP&IFE w/QIG), Sjogren's syndrome antibods(ssa + ssb), Vitamin B12, Sedimentation rate, Rheumatoid factor, MR CERVICAL SPINE WO CONTRAST  Gait disturbance - Plan: MR CERVICAL SPINE WO CONTRAST  Hyperreflexia - Plan: MR CERVICAL SPINE WO CONTRAST  History of fusion of cervical spine - Plan: MR CERVICAL SPINE WO CONTRAST  Excessive daytime sleepiness - Plan: Home sleep test  OSA (obstructive sleep  apnea) -  Plan: Home sleep test  In summary, Mr. Beckner is a 62 year old man who has a long history of burning sensations in his feet that worsened several years ago.  On exam, he has reduced pinprick sensation up to the mid lower leg and reduced vibration sensation at the toes.  He appears to have a fairly sensory polyneuropathy with small fiber involvement more than large fiber.   Gait disturbance appears to be worse than would be expected given the degree of numbness.  To further evaluate his symptoms we will check an MRI of the cervical spine overwork program to rule out adjacent level disease).  We will also check blood work for SPEP/IEF, ESR, rheumatoid factor, SSA/SSB and B12.  He states that the gabapentin has helped and for the time being would prefer not to start a second medication.  If this worsens we could consider adding lamotrigine or a tricyclic.  We discussed that if the blood tests are normal that then I would like to check an NCV/EMG to further evaluate and we would consider a skin biopsy if the EMG was normal.  If the MRI of the cervical spine shows severe spinal stenosis or myelopathic changes would recommend referral to neurosurgery.  An additional issue is snoring and excessive daytime sleepiness.  He has a history of sleep apnea that was treated with surgery though snoring persisted.  He likely has residual OSA and we will check a home sleep study and try to optimize CPAP therapy if OSA is present.  Return to see me in 4 months but return for further studies as indicated or if symptoms worsen.  Also advised to call us if he would like to start a second medication.  Thank you for asking [place] to see Mr. Reddy.  Please let me know if I can be of further assistance with him or other patients in the future.     Maciah Schweigert A. Epimenio Foot, MD, [facility] 07/16/2023, 3:13 PM Certified in Neurology, Clinical Neurophysiology, Sleep Medicine and Neuroimaging  [facility] [address] [place], [place] [address] [phone]

## 2023-07-16 ENCOUNTER — Encounter: Payer: Self-pay | Admitting: Neurology

## 2023-07-16 ENCOUNTER — Ambulatory Visit (INDEPENDENT_AMBULATORY_CARE_PROVIDER_SITE_OTHER): Payer: BC Managed Care – PPO | Admitting: Neurology

## 2023-07-16 VITALS — BP 113/77 | HR 70 | Ht 77.0 in | Wt 290.0 lb

## 2023-07-16 DIAGNOSIS — R208 Other disturbances of skin sensation: Secondary | ICD-10-CM

## 2023-07-16 DIAGNOSIS — R269 Unspecified abnormalities of gait and mobility: Secondary | ICD-10-CM | POA: Diagnosis not present

## 2023-07-16 DIAGNOSIS — Z981 Arthrodesis status: Secondary | ICD-10-CM

## 2023-07-16 DIAGNOSIS — G629 Polyneuropathy, unspecified: Secondary | ICD-10-CM | POA: Diagnosis not present

## 2023-07-16 DIAGNOSIS — R292 Abnormal reflex: Secondary | ICD-10-CM | POA: Diagnosis not present

## 2023-07-16 DIAGNOSIS — G4733 Obstructive sleep apnea (adult) (pediatric): Secondary | ICD-10-CM

## 2023-07-16 DIAGNOSIS — G4719 Other hypersomnia: Secondary | ICD-10-CM

## 2023-07-17 LAB — SEDIMENTATION RATE: Sed Rate: 3 mm/hr (ref 0–30)

## 2023-07-17 LAB — VITAMIN B12: Vitamin B-12: 351 pg/mL (ref 232–1245)

## 2023-07-17 LAB — MULTIPLE MYELOMA PANEL, SERUM

## 2023-07-17 LAB — SJOGREN'S SYNDROME ANTIBODS(SSA + SSB)
ENA SSA (RO) Ab: <0.2 AI (ref 0.0–0.9)
ENA SSB (LA) Ab: <0.2 AI (ref 0.0–0.9)

## 2023-07-17 LAB — RHEUMATOID FACTOR: Rheumatoid fact SerPl-aCnc: <10 IU/mL (ref <14.0)

## 2023-07-18 ENCOUNTER — Telehealth: Payer: Self-pay | Admitting: Neurology

## 2023-07-18 NOTE — Telephone Encounter (Signed)
[employer] Numbers: [id number] exp. 07/16/23-08/14/23 sent to [facility] due to his weight and his last MRI was under anesthesia.  [phone]

## 2023-08-07 ENCOUNTER — Telehealth: Payer: Self-pay | Admitting: Neurology

## 2023-08-07 NOTE — Telephone Encounter (Signed)
LVM and sent mychart msg informing pt of need to reschedule 11/20/23 appt - MD out

## 2023-08-21 ENCOUNTER — Ambulatory Visit: Payer: BC Managed Care – PPO | Admitting: Neurology

## 2023-08-21 DIAGNOSIS — G4733 Obstructive sleep apnea (adult) (pediatric): Secondary | ICD-10-CM | POA: Diagnosis not present

## 2023-08-21 DIAGNOSIS — G4719 Other hypersomnia: Secondary | ICD-10-CM

## 2023-08-22 ENCOUNTER — Ambulatory Visit: Payer: BC Managed Care – PPO | Admitting: Neurology

## 2023-08-28 ENCOUNTER — Telehealth: Payer: Self-pay

## 2023-08-28 NOTE — Telephone Encounter (Signed)
LVM for patient that the sleep lab rec'd the results of his home sleep test. Report has been generated for MD to read. Pt was informed on this message he can throw device away in the trash. Also let patient know to be on the lookout for a call from our office about the results of his study.

## 2023-08-28 NOTE — Progress Notes (Unsigned)
   [facility]  HOME SLEEP STUDY  STUDY DATE: 08/21/2023 PATIENT NAME: Scott Barber DOB: 17-Aug-1961 MRN: [id number]  ORDERING CLINICIAN: Richard A. Epimenio Foot, MD, PhD INTERPRETING CLINICIAN: Richard A. Sater, MD. PhD   CLINICAL INFORMATION: 62 year old man with snoring and excessive daytime sleepiness undergoing evaluation for obstructive sleep apnea   IMPRESSION:  Severe obstructive sleep apnea with a pAHI 3% of 37.1/h.  Negligible central apneas were noted. Reduced sleep efficiency of 62% Nocturnal hypoxemia with 22 minutes of SaO2 below 88% and 4 minutes below 85%   RECOMMENDATION: Recommend AutoPap 5 to 15 cm H2O pressure with heated humidifier.  Download in 30 days Follow-up with Dr. Epimenio Foot.   INTERPRETING PHYSICIAN:   Richard A. Epimenio Foot, MD, PhD, [facility] Certified in Neurology, Clinical Neurophysiology, Sleep Medicine, Pain Medicine and Neuroimaging  [facility] [address], [address] [place], [place] [address] [phone]

## 2023-08-29 ENCOUNTER — Telehealth: Payer: Self-pay | Admitting: *Deleted

## 2023-08-29 DIAGNOSIS — G4719 Other hypersomnia: Secondary | ICD-10-CM

## 2023-08-29 DIAGNOSIS — G4733 Obstructive sleep apnea (adult) (pediatric): Secondary | ICD-10-CM

## 2023-08-29 NOTE — Telephone Encounter (Signed)
I called pt and LMVM for him with sleep study results.

## 2023-08-29 NOTE — Telephone Encounter (Signed)
-----   Message from Asa Lente sent at 08/29/2023 10:42 AM EDT ----- Please let him know that the sleep study showed severe sleep apnea and I would like him to start CPAP.  We can order AutoPap 5 to 15 cm H2O pressure with heated humidifier and a follow-up with me for aAmy in about 3 months

## 2023-08-29 NOTE — Telephone Encounter (Signed)
LMVM for pt to return call.   

## 2023-08-29 NOTE — Telephone Encounter (Signed)
Called pt back to speak about sleep results that [facility] had called him to give him and phone hung up.

## 2023-08-29 NOTE — Telephone Encounter (Signed)
Pt returned call. Please call back when available. 

## 2023-08-30 NOTE — Telephone Encounter (Signed)
[employer] Berkley Harvey: [id number] exp. 08/30/23-09/28/23

## 2023-09-02 NOTE — Telephone Encounter (Signed)
I called pt and relayed message about sleep study results. SEVERE OSA.  Recommend autopap 5-15cm.  Pt verbalized understanding.  Has been using neighbors machine.  DME wil be [facility].  ([place], [place]).  Order to be signed then will be faxed to [place] [facility] pt.

## 2023-09-02 NOTE — Addendum Note (Signed)
Addended by: Guy Begin on: 09/02/2023 05:03 PM   Modules accepted: Orders

## 2023-09-03 ENCOUNTER — Ambulatory Visit (HOSPITAL_COMMUNITY)
Admission: RE | Admit: 2023-09-03 | Discharge: 2023-09-03 | Disposition: A | Discharge status: Home / Self Care | Payer: BC Managed Care – PPO | Source: Ambulatory Visit | Attending: Neurology | Admitting: Neurology

## 2023-09-03 DIAGNOSIS — R269 Unspecified abnormalities of gait and mobility: Secondary | ICD-10-CM | POA: Diagnosis present

## 2023-09-03 DIAGNOSIS — R292 Abnormal reflex: Secondary | ICD-10-CM | POA: Insufficient documentation

## 2023-09-03 DIAGNOSIS — R208 Other disturbances of skin sensation: Secondary | ICD-10-CM | POA: Insufficient documentation

## 2023-09-03 DIAGNOSIS — Z981 Arthrodesis status: Secondary | ICD-10-CM | POA: Insufficient documentation

## 2023-09-03 NOTE — Telephone Encounter (Signed)
Faxed to DME [facility] [phone] with confirmation received.

## 2023-09-09 ENCOUNTER — Telehealth: Payer: Self-pay | Admitting: Neurology

## 2023-09-09 NOTE — Telephone Encounter (Signed)
I called and [facility] pt office already closed.  Will call again tomorrow.  I called pt and he was understandably upset.  I relayed that I have my fax that 19 pgs was sent with confirmation will try again

## 2023-09-09 NOTE — Telephone Encounter (Signed)
Wife has called to report that she just called DME, she was told they have not received anything from Dr Epimenio Foot re: CPAP and supplies.  Wife is asking to be called once it has been sent off to DME again

## 2023-09-10 ENCOUNTER — Encounter: Payer: Self-pay | Admitting: Neurology

## 2023-09-10 NOTE — Telephone Encounter (Signed)
Called DME and they did receive 18 pgs and will work on this.  Pt informed.  He appreciated call back.

## 2023-09-10 NOTE — Telephone Encounter (Signed)
I called [facility].  They received order but that was all.  I clarified fax and she gave me a different fax # [phone], I refaxed to this # 18 pgs.

## 2023-09-11 NOTE — Telephone Encounter (Signed)
Error

## 2023-09-18 ENCOUNTER — Encounter: Payer: Self-pay | Admitting: Neurology

## 2023-09-19 ENCOUNTER — Other Ambulatory Visit: Payer: Self-pay | Admitting: Neurology

## 2023-09-19 DIAGNOSIS — R208 Other disturbances of skin sensation: Secondary | ICD-10-CM

## 2023-09-19 DIAGNOSIS — R269 Unspecified abnormalities of gait and mobility: Secondary | ICD-10-CM

## 2023-11-20 ENCOUNTER — Ambulatory Visit: Payer: BC Managed Care – PPO | Admitting: Neurology

## 2023-11-26 ENCOUNTER — Encounter: Payer: Self-pay | Admitting: Neurology

## 2023-11-26 ENCOUNTER — Ambulatory Visit (INDEPENDENT_AMBULATORY_CARE_PROVIDER_SITE_OTHER): Payer: BC Managed Care – PPO | Admitting: Neurology

## 2023-11-26 VITALS — BP 130/71 | HR 72 | Ht 77.0 in | Wt 294.5 lb

## 2023-11-26 DIAGNOSIS — R269 Unspecified abnormalities of gait and mobility: Secondary | ICD-10-CM | POA: Diagnosis not present

## 2023-11-26 DIAGNOSIS — R208 Other disturbances of skin sensation: Secondary | ICD-10-CM

## 2023-11-26 DIAGNOSIS — Z981 Arthrodesis status: Secondary | ICD-10-CM

## 2023-11-26 DIAGNOSIS — G629 Polyneuropathy, unspecified: Secondary | ICD-10-CM | POA: Diagnosis not present

## 2023-11-26 DIAGNOSIS — G4733 Obstructive sleep apnea (adult) (pediatric): Secondary | ICD-10-CM

## 2023-11-26 DIAGNOSIS — G4719 Other hypersomnia: Secondary | ICD-10-CM

## 2023-11-26 MED ORDER — METHYLPHENIDATE HCL ER (OSM) 54 MG PO TBCR: 54.0000 mg | EXTENDED_RELEASE_TABLET | ORAL | 0 refills | Status: DC

## 2023-11-26 MED ORDER — LAMOTRIGINE 100 MG PO TABS: 100.0000 mg | ORAL_TABLET | Freq: Two times a day (BID) | ORAL | 5 refills | Status: DC

## 2023-11-26 NOTE — Progress Notes (Signed)
[facility]  PATIENT: Scott Barber DOB: 11-01-1961  REFERRING DOCTOR OR PCP: Darcel Smalling, MD SOURCE: Patient, notes from primary care, imaging and lab reports, I personally reviewed  _________________________________   HISTORICAL  CHIEF COMPLAINT:  Chief Complaint  Patient presents with   Follow-up    Pt in room 10 alone. Here for Polyneuropathy follow up. Pt said Polyneuropathy is not better. Reports doing well on cpap machine. Pt did bring cpap machine, but has app on phone.     HISTORY OF PRESENT ILLNESS:   Scott Barber is a 62 y.o. man with polyneuopathy lower leg/foot dysesthesias.  Update 11/26/2023 He continues to note dysesthesia.   He is currently on gabapentin 300 mg po tid.  He felt sleepy and had ringing in his ears so tried to back the dose off but pain worsened and he resumed 3 times a day.     He has sleepiness during the day, often while siting.   ESS today was 18/24 -- worse over last 3 months.   He is on armodafinil 250 mg daily.   Home sleep test 08/21/23 showed AHI = 37.1/hr.    In the past  he had UPPP surgery and still uses PAP due to snoring.   He uses CPAP every night and gets 6 hours use a night.   He does well with the new headgear.     Download shows 100% compliance and AHI = 1.9/h excellent.    In the am, he feels his feet are inflated and his gait is mildly off.    He has ACDF from C3-C6.   He had muscle atrophy in the left arm.     H/o polyneuropathy: He is a 62 year old man who had the onset of bilateral foot pain twenty years ago.   He recallsski boots were poorly fit and he lost his toenails.   Later, poorly fitting shoes also cause loss of toenails.    Over the last 2-3 years, symptoms have worsened.   He notes burning in his toes.  He has allodynia and the sheet rubbing over the toes would be painful.    Current distribution is feet, anles and distal lower leg bilaterally.      He has neck pain and poor ROM in his neck .    Gait is mildly off balanced.   He has urinary urgency  He has had some trauma being 'run over' by a horse.  He also had trauma leading to a broken pelvis.    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     He has left esotropia and also has visual problems on the right.   He does not have DM.      EPWORTH SLEEPINESS SCALE  On a scale of 0 - 3 what is the chance of dozing:  Sitting and Reading:   2 Watching TV:    3 Sitting inactive in a public place: 2 Passenger in car for one hour: 3 Lying down to rest in the afternoon: 3 Sitting and talking to someone: 0 Sitting quietly after lunch:  3 In a car, stopped in traffic:  1  Total (out of 24):   17/24 (moderate EDS)   IMAGING Reports MRI cervical spine 01/05/2019 showed 1.  Cervical degenerative disc disease most significant at C4-5 and C5-6 with disc osteophyte causing moderate central canal stenosis and severe neural foraminal narrowing greater on  the left.  2.  C3-4 severe left neural foraminal narrowing.    REVIEW OF SYSTEMS: Constitutional: No fevers, chills, sweats, or change in appetite Eyes: No visual changes, double vision, eye pain Ear, nose and throat: No hearing loss, ear pain, nasal congestion, sore throat Cardiovascular: No chest pain, palpitations Respiratory:  No shortness of breath at rest or with exertion.   No wheezes GastrointestinaI: No nausea, vomiting, diarrhea, abdominal pain, fecal incontinence Genitourinary:  No dysuria, urinary retention or frequency.  No nocturia. Musculoskeletal:  No neck pain, back pain Integumentary: No rash, pruritus, skin lesions Neurological: as above Psychiatric: No depression at this time.  No anxiety Endocrine: No palpitations, diaphoresis, change in appetite, change in weigh or increased thirst Hematologic/Lymphatic:  No anemia, purpura, petechiae. Allergic/Immunologic: No itchy/runny eyes, nasal  congestion, recent allergic reactions, rashes  ALLERGIES: No Known Allergies  HOME MEDICATIONS:  Current Outpatient Medications:    Armodafinil 250 MG tablet, Take 250 mg by mouth every morning., Disp: , Rfl:    citalopram (CELEXA) 20 MG tablet, Take 20 mg by mouth at bedtime., Disp: , Rfl:    gabapentin (NEURONTIN) 300 MG capsule, Take 300 mg by mouth 3 (three) times daily., Disp: , Rfl:    venlafaxine XR (EFFEXOR-XR) 150 MG 24 hr capsule, Take 300 mg by mouth daily., Disp: , Rfl:   PAST MEDICAL HISTORY: Past Medical History:  Diagnosis Date   Anxiety    Arthritis    Depression    GERD (gastroesophageal reflux disease)    Pneumonia    Sleep apnea    uses cpap    PAST SURGICAL HISTORY: Past Surgical History:  Procedure Laterality Date   EXPLORATION POST OPERATIVE OPEN HEART     05/2022   FRACTURE SURGERY Left    femur fracture   RADIOLOGY WITH ANESTHESIA Right 09/23/2018   Procedure: MRI WITH ANESTHESIA OF RIGHT HIP WITHOUT CONTRAST;  Surgeon: Radiologist, Medication, MD;  Location: [facility] OR;  Service: Radiology;  Laterality: Right;   UVULOPALATOPHARYNGOPLASTY      FAMILY HISTORY: Family History  Problem Relation Age of Onset   Cancer Mother    Dementia Father     SOCIAL HISTORY: Social History   Socioeconomic History   Marital status: Married    Spouse name: Not on file   Number of children: Not on file   Years of education: Not on file   Highest education level: Not on file  Occupational History   Not on file  Tobacco Use   Smoking status: Never   Smokeless tobacco: Never  Vaping Use   Vaping status: Never Used  Substance and Sexual Activity   Alcohol use: Yes    Comment: rare   Drug use: Never   Sexual activity: Not on file  Other Topics Concern   Not on file  Social History Narrative   Right Handed   6 cups of Coffee per day   Social Determinants of Health   Financial Resource Strain: Patient Declined (07/02/2022)   Received [facility],  [facility]   Overall Financial Resource Strain (CARDIA)    Difficulty of Paying Living Expenses: Patient declined  Food Insecurity: Unknown (02/06/2022)   Received from [facility], [facility]   Hunger Vital Sign    Worried About Running Out of Food in the Last Year: Patient declined    Ran Out of Food in the Last Year: Patient declined  Transportation Needs: No Transportation Needs (07/03/2022)   Received from [facility], [facility]  PRAPARE - Administrator, Civil Service (Medical): No    Lack of Transportation (Non-Medical): No  Physical Activity: Sufficiently Active (02/06/2022)   Received from [facility], [facility]   Exercise Vital Sign    Days of Exercise per Week: 7 days    Minutes of Exercise per Session: 60 min  Stress: No Stress Concern Present (07/02/2022)   Received from [facility], [facility] - Occupational Stress Questionnaire    Feeling of Stress : Only a little  Social Connections: Unknown (04/23/2022)   Received from [facility], [facility]   Social Network    Social Network: Not on file  Intimate Partner Violence: Unknown (03/26/2022)   Received from [facility], [facility]   HITS    Physically Hurt: Not on file    Insult or Talk Down To: Not on file    Threaten Physical Harm: Not on file    Scream or Curse: Not on file       PHYSICAL EXAM  Vitals:   11/26/23 0935  BP: 130/71  Pulse: 72  Weight: 294 lb 8 oz (133.6 kg)  Height: 6\' 5"  (1.956 m)    Body mass index is 34.92 kg/m.   General: The patient is well-developed and well-nourished and in no acute distress  HEENT:  Head is [place]/AT.  Sclera are anicteric.   Neck:   The neck is nontender and has a reduced range of motion.  ACDF scar noted  Skin: Extremities are without rash or  edema.  Musculoskeletal:  Back is nontender  Neurologic Exam  Mental status: The patient is alert and oriented x 3 at the time  of the examination. The patient has apparent normal recent and remote memory, with an apparently normal attention span and concentration ability.   Speech is normal.  Cranial nerves: Extraocular movements are full. Pupils are equal, round, and reactive to light and accomodation.  Visual fields are full.  Facial symmetry is present. There is good facial sensation to soft touch bilaterally.Facial strength is normal.  Trapezius and sternocleidomastoid strength is normal. No dysarthria is noted.   No obvious hearing deficits are noted.  Motor:  Muscle bulk is normal.   Tone is normal. Strength is  5 / 5 in all 4 extremities.   Sensory: Sensory testing is intact in the arms and hands.  He has reduced sensation to pinprick from the mid lower leg down with absent pinprick sensation below the ankles.  Vibration sensation was 75% at the ankles and <10% at the toes..  Coordination: Cerebellar testing reveals good finger-nose-finger and heel-to-shin bilaterally.  Gait and station: Station is normal.   Gait is arthritic. Tandem gait is wide. Romberg is negative.   Reflexes: Deep tendon reflexes are symmetric and normal in the arms 2 at the knees and 2+ at the ankles 1 at ankles..   Plantar responses are flexor.      ASSESSMENT AND PLAN  No diagnosis found.  Continue CPAP.  D/L shows 100% compliance and good efficacy with AHI = 1.9 Add lamotrigine for dysesthesias.   If much better can d/c gabapentin Stay active and exercise.    Concerta for EDS not helped much by Nuvigil. Rtc 6 months   Reiss Mowrey A. Epimenio Foot, MD, [facility] 11/26/2023, 9:45 AM Certified in Neurology, Clinical Neurophysiology, Sleep Medicine and Neuroimaging  [facility] [address] [place], [place] [address] [phone]

## 2023-11-26 NOTE — Patient Instructions (Signed)
The pharmacy has the prescription for lamotrigine 100 mg tablets. For 5 days, just take one half pill a day. For the next 5 days, take one half pill twice a day. For the next 5 days, take one half pill in the morning and one pill at night Then start taking one pill twice a day from this point on.      If you get a rash, need to stop the medication and not take it again.

## 2024-01-02 ENCOUNTER — Other Ambulatory Visit: Payer: Self-pay | Admitting: Neurology

## 2024-01-03 MED ORDER — METHYLPHENIDATE HCL ER (OSM) 54 MG PO TBCR: 54.0000 mg | EXTENDED_RELEASE_TABLET | ORAL | 0 refills | Status: DC

## 2024-01-03 NOTE — Telephone Encounter (Signed)
 Requested Prescriptions   Pending Prescriptions Disp Refills   methylphenidate  (CONCERTA ) 54 MG PO CR tablet 30 tablet 0    Sig: Take 1 tablet (54 mg total) by mouth every morning.   Last seen 11/26/23, next appt 06/10/24 Dispenses   Dispensed Days Supply Quantity Provider Pharmacy  METHYLPHENIDATE  54MG  ER OSM TABLET 11/26/2023 30 30 each Sater, Charlie LABOR, MD [facility]...      How do dispenses affect the score?

## 2024-03-04 ENCOUNTER — Telehealth: Payer: Self-pay | Admitting: Neurology

## 2024-03-04 ENCOUNTER — Other Ambulatory Visit: Payer: Self-pay | Admitting: Neurology

## 2024-03-04 DIAGNOSIS — G629 Polyneuropathy, unspecified: Secondary | ICD-10-CM

## 2024-03-04 DIAGNOSIS — R208 Other disturbances of skin sensation: Secondary | ICD-10-CM

## 2024-03-04 NOTE — Telephone Encounter (Signed)
 Wife is asking the order be placed again in order for pt to schedule his NCV/EMG

## 2024-03-11 ENCOUNTER — Ambulatory Visit (INDEPENDENT_AMBULATORY_CARE_PROVIDER_SITE_OTHER): Admitting: Neurology

## 2024-03-11 ENCOUNTER — Encounter: Payer: Self-pay | Admitting: Neurology

## 2024-03-11 VITALS — BP 126/80 | Ht 77.0 in

## 2024-03-11 DIAGNOSIS — R208 Other disturbances of skin sensation: Secondary | ICD-10-CM

## 2024-03-11 DIAGNOSIS — M792 Neuralgia and neuritis, unspecified: Secondary | ICD-10-CM | POA: Insufficient documentation

## 2024-03-11 DIAGNOSIS — G629 Polyneuropathy, unspecified: Secondary | ICD-10-CM

## 2024-03-11 NOTE — Procedures (Signed)
 Full Name: Scott Barber Gender: Male MRN #: [id number] Date of Birth: 1961-08-31    Visit Date: 03/11/2024 10:59 Age: 63 Years Examining Physician: Levert Feinstein Referring Physician: Despina Arias Height: 6 feet 5 inch History: 63 year old male presenting with more than 10 years history of progressive worsening lower extremity paresthesia, history of cervical decompression surgery for left cervical radiculopathy  Summary of the test:  Nerve conduction study: Bilateral sural, superficial peroneal sensory responses were absent.  Bilateral tibial, peroneal to EDB motor responses were absent.  Left ulnar sensory and motor responses were normal, with well-preserved left ulnar F-wave latency  Left median sensory response showed no significant abnormality  Electromyography: Selected needle examination was performed at bilateral lower extremity muscles, bilateral lumbosacral paraspinal muscles; left upper extremity muscles and left cervical paraspinal muscles.  There was evidence of chronic neuropathic changes involving distal leg muscles.  There was also evidence of spontaneous activity, chronic neuropathic changes at left abductor pollicis longus.  There was no spontaneous activity at bilateral lumbosacral paraspinal muscles.  There was chronic neuropathic changes involving left C5, 6 myotomes.  There was no spontaneous activity at left cervical paraspinal muscles.  Conclusion: This is an abnormal study.  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  There is also evidence of chronic left C5-6 cervical radiculopathy.    Levert Feinstein. M.D. Ph.D.   [facility] [address] [place], [place] [address] Tel: [phone] Fax: [phone]  Verbal informed consent was obtained from the patient, patient was informed of potential risk of procedure, including bruising, bleeding, hematoma  formation, infection, muscle weakness, muscle pain, numbness, among others.        MNC    Nerve / Sites Muscle Latency Ref. Amplitude Ref. Rel Amp Segments Distance Velocity Ref. Area    ms ms mV mV %  cm m/s m/s mVms  L Ulnar - ADM     Wrist ADM 2.8 <=3.3 11.4 >=6.0 100 Wrist - ADM 7   35.1     B.Elbow ADM 5.3  9.3  81.7 B.Elbow - Wrist 15 60 >=49 27.4     A.Elbow ADM 8.1  11.1  119 A.Elbow - B.Elbow 16 57 >=49 31.9  L Peroneal - EDB     Ankle EDB NR <=6.5 NR >=2.0 NR Ankle - EDB 9   NR         Pop fossa - Ankle      R Peroneal - EDB     Ankle EDB NR <=6.5 NR >=2.0 NR Ankle - EDB 9   NR         Pop fossa - Ankle      L Tibial - AH     Ankle AH NR <=5.8 NR >=4.0 NR Ankle - AH 9   NR  R Tibial - AH     Ankle AH NR <=5.8 NR >=4.0 NR Ankle - AH 9   NR               SNC    Nerve / Sites Rec. Site Peak Lat Ref.  Amp Ref. Segments Distance    ms ms V V  cm  L Sural - Ankle (Calf)     Calf Ankle NR <=4.4 NR >=6 Calf - Ankle 14  R Sural - Ankle (Calf)     Calf Ankle NR <=4.4 NR >=6 Calf - Ankle 14  L Superficial peroneal - Ankle  Lat leg Ankle NR <=4.4 NR >=6 Lat leg - Ankle 14  R Superficial peroneal - Ankle     Lat leg Ankle NR <=4.4 NR >=6 Lat leg - Ankle 14  L Median - Orthodromic (Dig II, Mid palm)     Dig II Wrist 3.7 <=3.4 12 >=10 Dig II - Wrist 15  L Ulnar - Orthodromic, (Dig V, Mid palm)     Dig V Wrist 2.9 <=3.1 7 >=5 Dig V - Wrist 22                 F  Wave    Nerve F Lat Ref.   ms ms  L Ulnar - ADM 30.5 <=32.0       EMG Summary Table    Spontaneous MUAP Recruitment  Muscle IA Fib PSW Fasc Other Amp Dur. Poly Pattern  L. Tibialis anterior Increased None None None _______ Increased Increased 1+ Reduced  L. Tibialis posterior Normal None None None _______ Normal Normal Normal Normal  L. Peroneus longus Normal None None None _______ Normal Normal Normal Reduced  L. Gastrocnemius (Medial head) Normal None None None _______ Normal Normal Normal Normal  L.  Abductor pollicis longus Increased !+ !+ None _______ Increased Increased 1+ Reduced  L. Vastus lateralis Normal None None None _______ Normal Normal Normal Normal  R. Tibialis anterior Increased None None None _______ Increased Increased 1+ Reduced  R. Tibialis posterior Normal None None None _______ Normal Normal Normal Normal  R. Peroneus longus Increased None None None _______ Increased Increased Normal Reduced  R. Gastrocnemius (Medial head) Normal None None None _______ Normal Normal Normal Normal  R. Vastus lateralis Normal None None None _______ Normal Normal Normal Normal  R. Lumbar paraspinals (low) Normal None None None _______ Normal Normal Normal Normal  R. Lumbar paraspinals (mid) Normal None None None _______ Normal Normal Normal Normal  L. First dorsal interosseous Normal None None None _______ Normal Normal Normal Normal  L. Brachioradialis Increased None None None _______ Increased Increased 1+ Reduced  L. Extensor digitorum communis Normal None None None _______ Normal Normal Normal Reduced  L. Biceps brachii Normal None None None _______ Increased Increased 1+ Reduced  L. Deltoid Normal None None None _______ Increased Increased 1+ Reduced  L. Triceps brachii Normal None None None _______ Normal Normal Normal Normal  L. Cervical paraspinals Normal None None None _______ Normal Normal Normal Normal

## 2024-03-11 NOTE — Progress Notes (Signed)
 Chief Complaint  Patient presents with   EMG/NCV    Rm 3       ASSESSMENT AND PLAN  Scott Barber is a 63 y.o. male   Slow worsening bilateral lower extremity paresthesia History of left cervical radiculopathy, status post decompression ACDF C3-6  EMG nerve conduction study today demonstrate moderately severe axonal sensorimotor polyneuropathy, there was no demyelinating features, there is also evidence of chronic left cervical radiculopathy,  Polypharmacy for neuropathic pain  DIAGNOSTIC DATA (LABS, IMAGING, TESTING) - I reviewed patient records, labs, notes, testing and imaging myself where available.   MEDICAL HISTORY:  Scott Barber, is a 63 year old male, referred by Dr. Despina Arias for electrodiagnostic study, seen in request by [facility] at [facility] Dr. Lillie Columbia, Andee Poles   History is obtained from the patient and review of electronic medical records. I personally reviewed pertinent available imaging films in PACS.   PMHx of  Depression, anxiety,  Sleep apnea,  Left femur fracture Cervical fusion ACDF C3-6 for left cervical radiculopathy  He had surgery of left cervical infusion for left cervical radiculopathy many years ago, which did help his left arm paresthesia and weakness, even before that, he has some bilateral feet numbness tingling, starting at the foot, gradually getting worse over the past 10 years, now to above ankle level, increased difficulty over the past 5 years, he describes fairly symmetric bilateral lower extremity numb, tingling, burning painful sensation, also noticed mild unsteady gait, which is also complicated by his left hip pain, eventually had left replacement  He denies significant neck pain, denies low back pain, no upper extremity paresthesia, had bladder urgency  MRI of cervical spine from September 2024, showed anterior fusion from C3-C6, significant canal stenosis  He is on polypharmacy for his depression  anxiety, lamotrigine 100 twice a day Celexa 20 mg daily, Effexor 300 mg daily, gabapentin 300 mg 3 times a day has been helpful for his lower extremity discomfort Lab from July showed normal or negative B12, protein electrophoresis, ESR rheumatoid factor, SSA B,   He denies a history of diabetes  PHYSICAL EXAM:   Vitals:   03/11/24 1011  BP: 126/80  Height: 6\' 5"  (1.956 m)     Body mass index is 34.92 kg/m.  PHYSICAL EXAMNIATION:  Gen: NAD, conversant, well nourised, well groomed                     Cardiovascular: Regular rate rhythm, no peripheral edema, warm, nontender. Eyes: Conjunctivae clear without exudates or hemorrhage Neck: Supple, no carotid bruits. Pulmonary: Clear to auscultation bilaterally   NEUROLOGICAL EXAM:  MENTAL STATUS: Speech/cognition: Awake, alert, oriented to history taking and casual conversation CRANIAL NERVES: CN II: Visual fields are full to confrontation. Pupils are round equal and briskly reactive to light. CN III, IV, VI: extraocular movement are normal. No ptosis. CN V: Facial sensation is intact to light touch CN VII: Face is symmetric with normal eye closure  CN VIII: Hearing is normal to causal conversation. CN IX, X: Phonation is normal. CN XI: Head turning and shoulder shrug are intact  MOTOR: There is no pronator drift of out-stretched arms. Muscle bulk and tone are normal. Muscle strength is normal.  REFLEXES: Reflexes are 2+ and symmetric at the biceps, triceps, knees, and trace at ankles. Plantar responses are flexor.  SENSORY: Hair light receding to midshin level, length-dependent decreased light touch, pinprick, vibratory sensation to distal shin level  COORDINATION: There is no trunk  or limb dysmetria noted.  GAIT/STANCE: Push-up to get up from seated position, cautious, negative Romberg sign  REVIEW OF SYSTEMS:  Full 14 system review of systems performed and notable only for as above All other review of systems were  negative.   ALLERGIES: No Known Allergies  HOME MEDICATIONS: Current Outpatient Medications  Medication Sig Dispense Refill   Armodafinil 250 MG tablet Take 250 mg by mouth every morning.     citalopram (CELEXA) 20 MG tablet Take 20 mg by mouth at bedtime.     gabapentin (NEURONTIN) 300 MG capsule Take 300 mg by mouth 3 (three) times daily.     lamoTRIgine (LAMICTAL) 100 MG tablet Take 1 tablet (100 mg total) by mouth 2 (two) times daily. 60 tablet 5   venlafaxine XR (EFFEXOR-XR) 150 MG 24 hr capsule Take 300 mg by mouth daily.     methylphenidate (CONCERTA) 54 MG PO CR tablet Take 1 tablet (54 mg total) by mouth every morning. 30 tablet 0   No current facility-administered medications for this visit.    PAST MEDICAL HISTORY: Past Medical History:  Diagnosis Date   Anxiety    Arthritis    Depression    GERD (gastroesophageal reflux disease)    Pneumonia    Sleep apnea    uses cpap    PAST SURGICAL HISTORY: Past Surgical History:  Procedure Laterality Date   EXPLORATION POST OPERATIVE OPEN HEART     05/2022   FRACTURE SURGERY Left    femur fracture   RADIOLOGY WITH ANESTHESIA Right 09/23/2018   Procedure: MRI WITH ANESTHESIA OF RIGHT HIP WITHOUT CONTRAST;  Surgeon: Radiologist, Medication, MD;  Location: [facility] OR;  Service: Radiology;  Laterality: Right;   UVULOPALATOPHARYNGOPLASTY      FAMILY HISTORY: Family History  Problem Relation Age of Onset   Cancer Mother    Dementia Father     SOCIAL HISTORY: Social History   Socioeconomic History   Marital status: Married    Spouse name: Not on file   Number of children: Not on file   Years of education: Not on file   Highest education level: Not on file  Occupational History   Not on file  Tobacco Use   Smoking status: Never   Smokeless tobacco: Never  Vaping Use   Vaping status: Never Used  Substance and Sexual Activity   Alcohol use: Yes    Comment: rare   Drug use: Never   Sexual activity: Not on file   Other Topics Concern   Not on file  Social History Narrative   Right Handed   6 cups of Coffee per day   Social Drivers of Health   Financial Resource Strain: Patient Declined (07/02/2022)   Received from [facility], [facility]   Overall Financial Resource Strain (CARDIA)    Difficulty of Paying Living Expenses: Patient declined  Food Insecurity: Unknown (02/06/2022)   Received from [facility], [facility]   Hunger Vital Sign    Worried About Running Out of Food in the Last Year: Patient declined    Ran Out of Food in the Last Year: Patient declined  Transportation Needs: No Transportation Needs (07/03/2022)   Received from [employer], [facility]   PRAPARE - Transportation    Lack of Transportation (Medical): No    Lack of Transportation (Non-Medical): No  Physical Activity: Sufficiently Active (02/06/2022)   Received from [facility], [facility]   Exercise Vital Sign    Days of Exercise per Week: 7  days    Minutes of Exercise per Session: 60 min  Stress: No Stress Concern Present (07/02/2022)   Received from [facility], [facility] - Occupational Stress Questionnaire    Feeling of Stress : Only a little  Social Connections: Unknown (04/23/2022)   Received from [facility], [facility]   Social Network    Social Network: Not on file  Intimate Partner Violence: Unknown (03/26/2022)   Received from [facility], [facility]   HITS    Physically Hurt: Not on file    Insult or Talk Down To: Not on file    Threaten Physical Harm: Not on file    Scream or Curse: Not on file      Levert Feinstein, M.D. Ph.D.  [facility] [address], [address] [place], [place] [address] Ph: [phone] Fax: [phone]  CC:  Nifong, Andee Poles, MD [address] [place],  [place] [address]  Nifong, Andee Poles, MD

## 2024-06-10 ENCOUNTER — Ambulatory Visit (INDEPENDENT_AMBULATORY_CARE_PROVIDER_SITE_OTHER): Payer: BC Managed Care – PPO | Admitting: Neurology

## 2024-06-10 ENCOUNTER — Encounter: Payer: Self-pay | Admitting: Neurology

## 2024-06-10 VITALS — BP 121/71 | HR 70 | Ht 77.0 in | Wt 292.6 lb

## 2024-06-10 DIAGNOSIS — G629 Polyneuropathy, unspecified: Secondary | ICD-10-CM

## 2024-06-10 DIAGNOSIS — G4719 Other hypersomnia: Secondary | ICD-10-CM

## 2024-06-10 DIAGNOSIS — G4733 Obstructive sleep apnea (adult) (pediatric): Secondary | ICD-10-CM

## 2024-06-10 DIAGNOSIS — R208 Other disturbances of skin sensation: Secondary | ICD-10-CM | POA: Diagnosis not present

## 2024-06-10 DIAGNOSIS — R269 Unspecified abnormalities of gait and mobility: Secondary | ICD-10-CM

## 2024-06-10 MED ORDER — LISDEXAMFETAMINE DIMESYLATE 60 MG PO CAPS: 60.0000 mg | ORAL_CAPSULE | ORAL | 0 refills | Status: DC

## 2024-06-10 MED ORDER — LAMOTRIGINE 150 MG PO TABS: 150.0000 mg | ORAL_TABLET | Freq: Two times a day (BID) | ORAL | 11 refills | Status: DC

## 2024-06-10 NOTE — Progress Notes (Signed)
 [facility]  PATIENT: Scott Barber DOB: 1961/07/07  REFERRING DOCTOR OR PCP: Verdie Gladden, MD SOURCE: Patient, notes from primary care, imaging and lab reports, I personally reviewed  _________________________________   HISTORICAL  CHIEF COMPLAINT:  Chief Complaint  Patient presents with   Follow-up    Pt in room 11. Wife in room. Here for lower leg/foot dysesthesias. Pt report pain is still there, pt said right leg has a terrible sharp pain. Pt uses cpap machine nightly.    HISTORY OF PRESENT ILLNESS:   Scott Barber is a 63 y.o. man with polyneuopathy lower leg/foot dysesthesias.  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 There is also evidence of chronic left C5-6 cervical radiculopathy.   He continues to note dysesthesia.   He is currently on gabapentin 300 mg po tid and lamotrigine  100 mg po bid -- since adding lamotrigine  he is slightly better     He has sleepiness during the day, often while siting.   ESS today was 16/24 -- about the same as last visit.   He is on armodafinil 250 mg daily and also takes 5 hour energy.  We tried methylphenidate  54 mg but he felt it did not help.  Adderall may have helped slightly Home sleep test 08/21/23 showed AHI = 37.1/hr.    In the past  he had UPPP surgery and still uses PAP due to snoring.   He uses CPAP every night and gets 6 hours use a night.   He does well with the new headgear.     Download last visit 100% compliance and AHI = 1.9/h excellent.    He notes his gait is mildly off.    He has ACDF from C3-C6.   He had muscle atrophy in the left arm.     H/o polyneuropathy: He is a 63 year old man who had the onset of bilateral foot pain twenty years ago.   He recallsski boots were poorly fit and he lost his toenails.   Later, poorly fitting shoes also cause loss of toenails.    Over the last 2-3 years, symptoms have worsened.   He notes burning in  his toes.  He has allodynia and the sheet rubbing over the toes would be painful.    Current distribution is feet, anles and distal lower leg bilaterally.     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 There is also evidence of chronic left C5-6 cervical radiculopathy.   7./2024 labs show RF, ESR, B12, ssa/ssb, SPEP/IEF were normal.    He has neck pain and poor ROM in his neck .   Gait is mildly off balanced.   He has urinary urgency  He has had some trauma being 'run over' by a horse.  He also had trauma leading to a broken pelvis.    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     He has left esotropia and also has visual problems on the right.   He does not have DM.      EPWORTH SLEEPINESS SCALE  On a scale of 0 - 3 what is the chance of dozing:  Sitting and Reading:   3 Watching TV:    2 Sitting inactive in a public place: 2 Passenger in car for one hour: 2 Lying down to rest in the afternoon: 3 Sitting and talking to someone: 1 Sitting quietly after  lunch:  2 In a car, stopped in traffic:  1  Total (out of 24):   16/24 (moderate EDS)   IMAGING Reports MRI cervical spine 01/05/2019 showed 1.  Cervical degenerative disc disease most significant at C4-5 and C5-6 with disc osteophyte causing moderate central canal stenosis and severe neural foraminal narrowing greater on the left.  2.  C3-4 severe left neural foraminal narrowing.    REVIEW OF SYSTEMS: Constitutional: No fevers, chills, sweats, or change in appetite Eyes: No visual changes, double vision, eye pain Ear, nose and throat: No hearing loss, ear pain, nasal congestion, sore throat Cardiovascular: No chest pain, palpitations Respiratory:  No shortness of breath at rest or with exertion.   No wheezes GastrointestinaI: No nausea, vomiting, diarrhea, abdominal pain, fecal  incontinence Genitourinary:  No dysuria, urinary retention or frequency.  No nocturia. Musculoskeletal:  No neck pain, back pain Integumentary: No rash, pruritus, skin lesions Neurological: as above Psychiatric: No depression at this time.  No anxiety Endocrine: No palpitations, diaphoresis, change in appetite, change in weigh or increased thirst Hematologic/Lymphatic:  No anemia, purpura, petechiae. Allergic/Immunologic: No itchy/runny eyes, nasal congestion, recent allergic reactions, rashes  ALLERGIES: No Known Allergies  HOME MEDICATIONS:  Current Outpatient Medications:    Armodafinil 250 MG tablet, Take 250 mg by mouth every morning., Disp: , Rfl:    citalopram (CELEXA) 20 MG tablet, Take 20 mg by mouth at bedtime., Disp: , Rfl:    gabapentin (NEURONTIN) 300 MG capsule, Take 300 mg by mouth 3 (three) times daily., Disp: , Rfl:    lisdexamfetamine (VYVANSE) 60 MG capsule, Take 1 capsule (60 mg total) by mouth every morning., Disp: 30 capsule, Rfl: 0   venlafaxine XR (EFFEXOR-XR) 150 MG 24 hr capsule, Take 300 mg by mouth daily., Disp: , Rfl:    lamoTRIgine  (LAMICTAL ) 150 MG tablet, Take 1 tablet (150 mg total) by mouth 2 (two) times daily., Disp: 60 tablet, Rfl: 11  PAST MEDICAL HISTORY: Past Medical History:  Diagnosis Date   Anxiety    Arthritis    Depression    GERD (gastroesophageal reflux disease)    Pneumonia    Sleep apnea    uses cpap    PAST SURGICAL HISTORY: Past Surgical History:  Procedure Laterality Date   EXPLORATION POST OPERATIVE OPEN HEART     05/2022   FRACTURE SURGERY Left    femur fracture   RADIOLOGY WITH ANESTHESIA Right 09/23/2018   Procedure: MRI WITH ANESTHESIA OF RIGHT HIP WITHOUT CONTRAST;  Surgeon: Radiologist, Medication, MD;  Location: [facility] OR;  Service: Radiology;  Laterality: Right;   UVULOPALATOPHARYNGOPLASTY      FAMILY HISTORY: Family History  Problem Relation Age of Onset   Cancer Mother    Dementia Father     SOCIAL  HISTORY: Social History   Socioeconomic History   Marital status: Married    Spouse name: Not on file   Number of children: Not on file   Years of education: Not on file   Highest education level: Not on file  Occupational History   Not on file  Tobacco Use   Smoking status: Never   Smokeless tobacco: Never  Vaping Use   Vaping status: Never Used  Substance and Sexual Activity   Alcohol use: Yes    Comment: rare   Drug use: Never   Sexual activity: Not on file  Other Topics Concern   Not on file  Social History Narrative   Right Handed   6 cups of Coffee  per day   Social Drivers of Health   Financial Resource Strain: Patient Declined (07/02/2022)   Received from [facility]   Overall Financial Resource Strain (CARDIA)    Difficulty of Paying Living Expenses: Patient declined  Food Insecurity: Unknown (02/06/2022)   Received from [facility]   Hunger Vital Sign    Within the past 12 months, you worried that your food would run out before you got the money to buy more.: Patient declined    Within the past 12 months, the food you bought just didn't last and you didn't have money to get more.: Patient declined  Transportation Needs: No Transportation Needs (07/03/2022)   Received from [facility] - Transportation    Lack of Transportation (Medical): No    Lack of Transportation (Non-Medical): No  Physical Activity: Sufficiently Active (02/06/2022)   Received from [facility]   Exercise Vital Sign    On average, how many days per week do you engage in moderate to strenuous exercise (like a brisk walk)?: 7 days    On average, how many minutes do you engage in exercise at this level?: 60 min  Stress: No Stress Concern Present (07/02/2022)   Received from [facility] - Occupational Stress Questionnaire    Feeling of Stress : Only a little  Social Connections: Unknown (04/23/2022)   Received from [facility]    Social Network    Social Network: Not on file  Intimate Partner Violence: Unknown (03/26/2022)   Received from [facility]   HITS    Physically Hurt: Not on file    Insult or Talk Down To: Not on file    Threaten Physical Harm: Not on file    Scream or Curse: Not on file       PHYSICAL EXAM  Vitals:   06/10/24 0950  BP: 121/71  Pulse: 70  Weight: 292 lb 9.6 oz (132.7 kg)  Height: 6' 5 (1.956 m)    Body mass index is 34.7 kg/m.   General: The patient is well-developed and well-nourished and in no acute distress  HEENT:  Head is [place]/AT.  Sclera are anicteric.   Neck:   The neck is nontender and has a reduced range of motion.  ACDF scar noted  Skin: Extremities are without rash or  edema.  Musculoskeletal:  Back is nontender  Neurologic Exam  Mental status: The patient is alert and oriented x 3 at the time of the examination. The patient has apparent normal recent and remote memory, with an apparently normal attention span and concentration ability.   Speech is normal.  Cranial nerves: Extraocular movements are full. Pupils are equal, round, and reactive to light and accomodation.  Visual fields are full.  Facial symmetry is present. There is good facial sensation to soft touch bilaterally.Facial strength is normal.  Trapezius and sternocleidomastoid strength is normal. No dysarthria is noted.   No obvious hearing deficits are noted.  Motor:  Muscle bulk is normal.   Tone is normal. Strength is  5 / 5 in all 4 extremities.   Sensory: Sensory testing is intact in the arms and hands.  He has reduced sensation to pinprick from the mid lower leg down with absent pinprick sensation below the ankles.  Vibration sensation was 50% at the ankles and <10% at the toes..  Coordination: Cerebellar testing reveals good finger-nose-finger and heel-to-shin bilaterally.  Gait and station: Station is normal.   Gait is  arthritic.Tandem gait is poor.  Romberg is negative.   Reflexes:  Deep tendon reflexes are symmetric and normal in the arms 2 at the knees and 2+ at the ankles 1 at ankles..   Plantar responses are flexor.      ASSESSMENT AND PLAN  Polyneuropathy  Dysesthesia  Gait disturbance  OSA (obstructive sleep apnea)  Excessive daytime sleepiness  Continue CPAP.  D/L shows 100% compliance and good efficacy with AHI = 1.9.   Increase lamotrigine  for dysesthesias.   If much better can d/c gabapentin Stay active and exercise.    Vyvanse 60 mg for EDS not helped much by Nuvigil.  Ok to take both Rtc 6 months   Akira Adelsberger A. Godwin Lat, MD, [facility] 06/10/2024, 10:32 AM Certified in Neurology, Clinical Neurophysiology, Sleep Medicine and Neuroimaging  [facility] [address] [place], [place] [address] [phone]

## 2024-07-06 ENCOUNTER — Other Ambulatory Visit: Payer: Self-pay | Admitting: Neurology

## 2024-07-06 MED ORDER — LISDEXAMFETAMINE DIMESYLATE 60 MG PO CAPS: 60.0000 mg | ORAL_CAPSULE | ORAL | 0 refills | Status: DC

## 2024-07-06 MED ORDER — LAMOTRIGINE 150 MG PO TABS: 150.0000 mg | ORAL_TABLET | Freq: Two times a day (BID) | ORAL | 11 refills | Status: DC

## 2024-07-06 NOTE — Telephone Encounter (Signed)
 Pt is requesting a refill for lisdexamfetamine (VYVANSE ) 60 MG capsule &   lamoTRIgine  (LAMICTAL ) 150 MG tablet  .  Pharmacy: [facility] [phone]

## 2024-07-06 NOTE — Telephone Encounter (Signed)
 Last seen on 06/10/24 Follow up scheduled on 01/07/25    Dispensed Days Supply Quantity Provider Pharmacy  LISDEXAMFETAMINE 60MG  CAPSULES 06/10/2024 30 30 each Sater, Charlie LABOR, MD [facility] DR[facility] STORE #...     Rx pending to be signed

## 2024-07-18 ENCOUNTER — Other Ambulatory Visit: Payer: Self-pay | Admitting: Neurology

## 2024-07-20 NOTE — Telephone Encounter (Signed)
 Pt note pt should be taking 150 mg Lamotrigine  BID   Rx denied.

## 2024-07-30 ENCOUNTER — Encounter: Payer: Self-pay | Admitting: Neurology

## 2024-08-03 ENCOUNTER — Other Ambulatory Visit: Payer: Self-pay | Admitting: Neurology

## 2024-08-03 MED ORDER — METHYLPREDNISOLONE 4 MG PO TABS: ORAL_TABLET | ORAL | 0 refills | Status: AC

## 2024-08-06 ENCOUNTER — Other Ambulatory Visit: Payer: Self-pay

## 2024-08-06 ENCOUNTER — Telehealth: Payer: Self-pay | Admitting: Neurology

## 2024-08-06 MED ORDER — LISDEXAMFETAMINE DIMESYLATE 60 MG PO CAPS: 60.0000 mg | ORAL_CAPSULE | ORAL | 0 refills | Status: DC

## 2024-08-06 NOTE — Telephone Encounter (Signed)
 Pt is requesting a refill for lisdexamfetamine (VYVANSE ) 60 MG capsule.  Pharmacy: [facility] [phone]

## 2024-08-06 NOTE — Telephone Encounter (Signed)
 Sent request to Dr. Vear

## 2024-09-02 ENCOUNTER — Other Ambulatory Visit: Payer: Self-pay | Admitting: Neurology

## 2024-09-02 MED ORDER — LISDEXAMFETAMINE DIMESYLATE 60 MG PO CAPS: 60.0000 mg | ORAL_CAPSULE | ORAL | 0 refills | Status: DC

## 2024-09-02 NOTE — Telephone Encounter (Signed)
 Last seen on 6/18/5 Follow up scheduled on 01/07/25   Dispensed Days Supply Quantity Provider Pharmacy  LISDEXAMFETAMINE 60MG  CAPSULES 08/06/2024 30 30 each Sater, Charlie LABOR, MD [facility] DRUG STORE #...     Rx pending to be signed

## 2024-09-02 NOTE — Telephone Encounter (Signed)
 Pt wife called to place medication refill lisdexamfetamine (VYVANSE ) 60 MG capsule   Pt wife will like medication sent to    [facility] DR[facility] STORE #[id number] [place],  - [address] AT [facility] [address] Phone: [phone]  Fax: [phone]

## 2024-09-06 ENCOUNTER — Encounter: Payer: Self-pay | Admitting: Neurology

## 2024-09-07 ENCOUNTER — Other Ambulatory Visit: Payer: Self-pay | Admitting: Neurology

## 2024-09-07 MED ORDER — OXCARBAZEPINE 300 MG PO TABS: 300.0000 mg | ORAL_TABLET | Freq: Two times a day (BID) | ORAL | 5 refills | Status: DC

## 2024-09-08 DIAGNOSIS — R300 Dysuria: Secondary | ICD-10-CM | POA: Insufficient documentation

## 2024-09-08 DIAGNOSIS — N390 Urinary tract infection, site not specified: Secondary | ICD-10-CM | POA: Insufficient documentation

## 2024-09-13 DIAGNOSIS — I5032 Chronic diastolic (congestive) heart failure: Secondary | ICD-10-CM | POA: Insufficient documentation

## 2024-09-16 DIAGNOSIS — G629 Polyneuropathy, unspecified: Secondary | ICD-10-CM | POA: Insufficient documentation

## 2024-09-17 DIAGNOSIS — N139 Obstructive and reflux uropathy, unspecified: Secondary | ICD-10-CM | POA: Insufficient documentation

## 2024-10-06 ENCOUNTER — Other Ambulatory Visit: Payer: Self-pay | Admitting: Neurology

## 2024-10-06 MED ORDER — LISDEXAMFETAMINE DIMESYLATE 60 MG PO CAPS: 60.0000 mg | ORAL_CAPSULE | ORAL | 0 refills | Status: DC

## 2024-10-06 NOTE — Telephone Encounter (Signed)
 Requested Prescriptions   Pending Prescriptions Disp Refills   lisdexamfetamine (VYVANSE ) 60 MG capsule 30 capsule 0    Sig: Take 1 capsule (60 mg total) by mouth every morning.   Last seen 06/10/24 Next appt 01/07/25  Dispenses   Dispensed Days Supply Quantity Provider Pharmacy  LISDEXAMFETAMINE 60MG  CAPSULES 09/02/2024 30 30 each Sater, Charlie LABOR, MD [facility] DRUG STORE #...  LISDEXAMFETAMINE 60MG  CAPSULES 08/06/2024 30 30 each Sater, Charlie LABOR, MD [facility] DRUG STORE #...  LISDEXAMFETAMINE 60MG  CAPSULES 07/09/2024 30 30 each Sater, Charlie LABOR, MD [facility] DRUG STORE #...  LISDEXAMFETAMINE 60MG  CAPSULES 06/10/2024 30 30 each Sater, Charlie LABOR, MD [facility] DRUG STORE #[email or web address]

## 2024-10-06 NOTE — Telephone Encounter (Signed)
 Pt's wife called wanting a refill request for the pt's lisdexamfetamine (VYVANSE ) 60 MG capsule be sent in to the [facility] in [place] on [address].

## 2024-10-12 ENCOUNTER — Encounter: Payer: Self-pay | Admitting: Neurology

## 2024-10-22 DIAGNOSIS — R8290 Unspecified abnormal findings in urine: Secondary | ICD-10-CM | POA: Insufficient documentation

## 2024-10-22 DIAGNOSIS — E66811 Obesity, class 1: Secondary | ICD-10-CM | POA: Insufficient documentation

## 2024-11-05 ENCOUNTER — Other Ambulatory Visit: Payer: Self-pay | Admitting: Neurology

## 2024-11-05 MED ORDER — LISDEXAMFETAMINE DIMESYLATE 60 MG PO CAPS: 60.0000 mg | ORAL_CAPSULE | ORAL | 0 refills | Status: DC

## 2024-11-05 NOTE — Telephone Encounter (Signed)
 Patient's wife request refill for lisdexamfetamine (VYVANSE ) 60 MG capsule send to [facility] STORE [phone]

## 2024-11-05 NOTE — Telephone Encounter (Signed)
 Last seen 06/10/24 Next appt 01/07/25 Dispenses   Dispensed Days Supply Quantity Provider Pharmacy  LISDEXAMFETAMINE 60MG  CAPSULES 10/06/2024 30 30 each Sater, Charlie LABOR, MD [facility] DRUG STORE #...  LISDEXAMFETAMINE 60MG  CAPSULES 09/02/2024 30 30 each Sater, Charlie LABOR, MD [facility] DRUG STORE #...  LISDEXAMFETAMINE 60MG  CAPSULES 08/06/2024 30 30 each Sater, Charlie LABOR, MD [facility] DRUG STORE #...  LISDEXAMFETAMINE 60MG  CAPSULES 07/09/2024 30 30 each Sater, Charlie LABOR, MD [facility] DRUG STORE #...  LISDEXAMFETAMINE 60MG  CAPSULES 06/10/2024 30 30 each Sater, Charlie LABOR, MD [facility] DRUG STORE #[email or web address]

## 2024-11-13 LAB — COLOGUARD: COLOGUARD: NEGATIVE

## 2024-12-07 ENCOUNTER — Other Ambulatory Visit: Payer: Self-pay | Admitting: Neurology

## 2024-12-07 ENCOUNTER — Encounter: Payer: Self-pay | Admitting: Neurology

## 2024-12-07 MED ORDER — LISDEXAMFETAMINE DIMESYLATE 60 MG PO CAPS: 60.0000 mg | ORAL_CAPSULE | ORAL | 0 refills | Status: DC

## 2024-12-07 NOTE — Telephone Encounter (Signed)
 Pt is needing a refill request for his lisdexamfetamine  (VYVANSE ) 60 MG capsule sent in to the [facility] on [address]

## 2024-12-07 NOTE — Telephone Encounter (Signed)
 Requested Prescriptions   Pending Prescriptions Disp Refills   lisdexamfetamine  (VYVANSE ) 60 MG capsule 30 capsule 0    Sig: Take 1 capsule (60 mg total) by mouth every morning.   LAST SEEN 06/10/24 NEXT APPT 01/07/25  Dispenses   Dispensed Days Supply Quantity Provider Pharmacy  LISDEXAMFETAMINE  60MG  CAPSULES 11/05/2024 30 30 each Sater, Charlie LABOR, MD [facility] DRUG STORE #...  LISDEXAMFETAMINE  60MG  CAPSULES 10/06/2024 30 30 each Sater, Charlie LABOR, MD [facility] DRUG STORE #...  LISDEXAMFETAMINE  60MG  CAPSULES 09/02/2024 30 30 each Sater, Charlie LABOR, MD [facility] DRUG STORE #...  LISDEXAMFETAMINE  60MG  CAPSULES 08/06/2024 30 30 each Sater, Charlie LABOR, MD [facility] DRUG STORE #...  LISDEXAMFETAMINE  60MG  CAPSULES 07/09/2024 30 30 each Sater, Charlie LABOR, MD [facility] DRUG STORE #...  LISDEXAMFETAMINE  60MG  CAPSULES 06/10/2024 30 30 each Sater, Charlie LABOR, MD [facility] DRUG STORE #[email or web address]

## 2024-12-30 ENCOUNTER — Telehealth: Payer: Self-pay | Admitting: Neurology

## 2024-12-30 NOTE — Telephone Encounter (Addendum)
 I called back and spoke to new pt appts for this pt. Responding back to RN call about medication is on and one they want to prescribe.  She LM for them to return my call.  They did not pick up or were they able to find person at the time.  Noted from drug registry that pt has been getting Vyvanse  (generic) from 06-10-2024 thru last fill 12-07-2024 #30.

## 2024-12-30 NOTE — Telephone Encounter (Signed)
 [facility] called to request to speak to nurse about Pt medication  lisdexamfetamine  (VYVANSE ) 60 MG capsule  They wanted to  know if Pt is still on medication due to they have  pt taking  another medication and don't want it to cancel  out Pt current medication , RN  call back [phone]

## 2024-12-30 NOTE — Telephone Encounter (Signed)
 Modesto Heys, NP at the [facility] called back.  She said that pt states that he has not been taking this (his wife pick up). Shows in Drug Registry that dispensing 30 tabs  06/10/24, 07/09/24, 08/06/24, 09/02/24, 10/06/24, 11/05/24, 12/07/2024.  I called pharmacy spoke to pharmacist they have pt log when someone pick ups.  Has shown been getting since .  05/2024.  I LMVM for pt to call back tomorrow after 0800.

## 2024-12-31 NOTE — Telephone Encounter (Signed)
 I called and LMVM for pt to return call (2nd call). Did mychart as well.

## 2025-01-04 NOTE — Telephone Encounter (Signed)
 LMVM for [facility], re: pt info fax # can fax to them as well.  We discontinued the vyvanse , whom pt said it did not work (he finished the refills).  I took off his med list.

## 2025-01-04 NOTE — Addendum Note (Signed)
 Addended by: NEYSA PARTICIA RAMAN on: 01/04/2025 01:21 PM   Modules accepted: Orders

## 2025-01-04 NOTE — Telephone Encounter (Signed)
 I called pt.   Is not taking the vyvanse  (lisdexamfetamine ) since finishing the last refill.  I will relay to Modesto Heys, at [facility].  I relayed will take off his med list.  He appreciated this. I LMVM for [facility] to return call.

## 2025-01-07 ENCOUNTER — Ambulatory Visit: Admitting: Neurology

## 2025-01-07 ENCOUNTER — Encounter: Payer: Self-pay | Admitting: Neurology

## 2025-01-07 VITALS — Ht 77.0 in | Wt 295.5 lb

## 2025-01-07 DIAGNOSIS — R609 Edema, unspecified: Secondary | ICD-10-CM | POA: Diagnosis not present

## 2025-01-07 DIAGNOSIS — G629 Polyneuropathy, unspecified: Secondary | ICD-10-CM | POA: Diagnosis not present

## 2025-01-07 DIAGNOSIS — G4733 Obstructive sleep apnea (adult) (pediatric): Secondary | ICD-10-CM | POA: Diagnosis not present

## 2025-01-07 DIAGNOSIS — R269 Unspecified abnormalities of gait and mobility: Secondary | ICD-10-CM | POA: Diagnosis not present

## 2025-01-07 DIAGNOSIS — R208 Other disturbances of skin sensation: Secondary | ICD-10-CM | POA: Diagnosis not present

## 2025-01-07 MED ORDER — LAMOTRIGINE 150 MG PO TABS: 150.0000 mg | ORAL_TABLET | Freq: Three times a day (TID) | ORAL | 11 refills | Status: AC

## 2025-01-07 MED ORDER — PREGABALIN 100 MG PO CAPS: 100.0000 mg | ORAL_CAPSULE | Freq: Two times a day (BID) | ORAL | 5 refills | Status: AC

## 2025-01-07 NOTE — Progress Notes (Signed)
 "  [facility]  PATIENT: Scott Barber DOB: Jan 11, 1961  REFERRING DOCTOR OR PCP: Inocente Floras, MD SOURCE: Patient, notes from primary care, imaging and lab reports, I personally reviewed  _________________________________   HISTORICAL  CHIEF COMPLAINT:  Chief Complaint  Patient presents with   RM10/POLYNEUROPATHY    Pt is here with his Wife. Pt states that his polyneuropathy has gotten worse lately. Pt states that he is having difficulty walking due to the polyneuropathy.     HISTORY OF PRESENT ILLNESS:   Scott Barber is a 64 y.o. man with polyneuopathy lower leg/foot dysesthesias.  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 There is also evidence of chronic left C5-6 cervical radiculopathy.     He is reporting more dysesthesias in his feet and legs.   He is currently on pregabalin  50-50-100 and lamotrigine  150 mg po bid. Pregabalin  had helped more than the gabapentin.    Pain had done better initially but a couple days ago, pain worsened.   Due to pain he is walking a little differently.   His knee pain feels like a needle going up into the kneecap.  He has a knee brace which sometimes helps.     Home sleep test 08/21/23 showed AHI = 37.1/hr.    In the past  he had UPPP surgery and still uses PAP due to snoring.   He uses CPAP every night and gets 6 hours use a night.   He does well with the new headgear.   Download last visit 100% compliance and AHI = 1.9/h excellent.    Armodafinil helps residual sleepiness  He notes his gait is a little worse    He has ACDF from C3-C6.   He had muscle atrophy in the left arm.     H/o polyneuropathy: He is a 64 year old man who had the onset of bilateral foot pain twenty years ago.   He recallsski boots were poorly fit and he lost his toenails.   Later, poorly fitting shoes also cause loss of toenails.    Over the last 2-3 years, symptoms have worsened.   He  notes burning in his toes.  He has allodynia and the sheet rubbing over the toes would be painful.    Current distribution is feet, anles and distal lower leg bilaterally.     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 There is also evidence of chronic left C5-6 cervical radiculopathy.   7./2024 labs show RF, ESR, B12, ssa/ssb, SPEP/IEF were normal.    He has neck pain and poor ROM in his neck .   Gait is mildly off balanced.   He has urinary urgency  He has had some trauma being 'run over' by a horse.  He also had trauma leading to a broken pelvis.    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     He has left esotropia and also has visual problems on the right.   He does not have DM.      EPWORTH SLEEPINESS SCALE  On a scale of 0 - 3 what is the chance of dozing:  Sitting and Reading:   3 Watching TV:    2 Sitting inactive in a public place: 2 Passenger in car for one hour: 2 Lying down to rest in the afternoon: 3 Sitting and talking to someone: 1 Sitting quietly after lunch:  2  In a car, stopped in traffic:  1  Total (out of 24):   16/24 (moderate EDS)   IMAGING Reports MRI cervical spine 01/05/2019 showed 1.  Cervical degenerative disc disease most significant at C4-5 and C5-6 with disc osteophyte causing moderate central canal stenosis and severe neural foraminal narrowing greater on the left.  2.  C3-4 severe left neural foraminal narrowing.    REVIEW OF SYSTEMS: Constitutional: No fevers, chills, sweats, or change in appetite Eyes: No visual changes, double vision, eye pain Ear, nose and throat: No hearing loss, ear pain, nasal congestion, sore throat Cardiovascular: No chest pain, palpitations Respiratory:  No shortness of breath at rest or with exertion.   No wheezes GastrointestinaI: No nausea, vomiting, diarrhea, abdominal pain, fecal  incontinence Genitourinary:  No dysuria, urinary retention or frequency.  No nocturia. Musculoskeletal:  No neck pain, back pain Integumentary: No rash, pruritus, skin lesions Neurological: as above Psychiatric: No depression at this time.  No anxiety Endocrine: No palpitations, diaphoresis, change in appetite, change in weigh or increased thirst Hematologic/Lymphatic:  No anemia, purpura, petechiae. Allergic/Immunologic: No itchy/runny eyes, nasal congestion, recent allergic reactions, rashes  ALLERGIES: No Known Allergies  HOME MEDICATIONS:  Current Outpatient Medications:    Armodafinil 250 MG tablet, Take 250 mg by mouth every morning., Disp: , Rfl:    citalopram (CELEXA) 20 MG tablet, Take 20 mg by mouth at bedtime., Disp: , Rfl:    pregabalin  (LYRICA ) 100 MG capsule, Take 1 capsule (100 mg total) by mouth 2 (two) times daily., Disp: 90 capsule, Rfl: 5   venlafaxine XR (EFFEXOR-XR) 150 MG 24 hr capsule, Take 300 mg by mouth daily., Disp: , Rfl:    venlafaxine XR (EFFEXOR-XR) 75 MG 24 hr capsule, Take 75 mg by mouth daily., Disp: , Rfl:    lamoTRIgine  (LAMICTAL ) 150 MG tablet, Take 1 tablet (150 mg total) by mouth 3 (three) times daily., Disp: 90 tablet, Rfl: 11   methylPREDNISolone  (MEDROL ) 4 MG tablet, Taper from 6 pills po for one day to 1 pill po the last day over 6 days (Patient not taking: Reported on 01/07/2025), Disp: 21 tablet, Rfl: 0  PAST MEDICAL HISTORY: Past Medical History:  Diagnosis Date   Anxiety    Arthritis    Depression    GERD (gastroesophageal reflux disease)    Pneumonia    Sleep apnea    uses cpap    PAST SURGICAL HISTORY: Past Surgical History:  Procedure Laterality Date   EXPLORATION POST OPERATIVE OPEN HEART     05/2022   FRACTURE SURGERY Left    femur fracture   RADIOLOGY WITH ANESTHESIA Right 09/23/2018   Procedure: MRI WITH ANESTHESIA OF RIGHT HIP WITHOUT CONTRAST;  Surgeon: Radiologist, Medication, MD;  Location: [facility] OR;  Service: Radiology;   Laterality: Right;   UVULOPALATOPHARYNGOPLASTY      FAMILY HISTORY: Family History  Problem Relation Age of Onset   Cancer Mother    Dementia Father     SOCIAL HISTORY: Social History   Socioeconomic History   Marital status: Married    Spouse name: Not on file   Number of children: Not on file   Years of education: Not on file   Highest education level: Not on file  Occupational History   Not on file  Tobacco Use   Smoking status: Never   Smokeless tobacco: Never  Vaping Use   Vaping status: Never Used  Substance and Sexual Activity   Alcohol use: Yes    Comment: rare  Drug use: Never   Sexual activity: Not on file  Other Topics Concern   Not on file  Social History Narrative   Right Handed   6 cups of Coffee per day   Social Drivers of Health   Tobacco Use: Low Risk (01/07/2025)   Patient History    Smoking Tobacco Use: Never    Smokeless Tobacco Use: Never    Passive Exposure: Not on file  Financial Resource Strain: Patient Declined (07/02/2022)   Received from [facility]   Overall Financial Resource Strain (CARDIA)    Difficulty of Paying Living Expenses: Patient declined  Food Insecurity: Unknown (02/06/2022)   Received from [facility]   Epic    Within the past 12 months, you worried that your food would run out before you got the money to buy more.: Patient declined    Within the past 12 months, the food you bought just didn't last and you didn't have money to get more.: Patient declined  Transportation Needs: No Transportation Needs (07/03/2022)   Received from [facility] - Transportation    Lack of Transportation (Medical): No    Lack of Transportation (Non-Medical): No  Physical Activity: Sufficiently Active (02/06/2022)   Received from [facility]   Exercise Vital Sign    On average, how many days per week do you engage in moderate to strenuous exercise (like a brisk walk)?: 7 days    On average, how many minutes do you engage  in exercise at this level?: 60 min  Stress: No Stress Concern Present (07/02/2022)   Received from [facility] - Occupational Stress Questionnaire    Feeling of Stress : Only a little  Social Connections: Unknown (02/06/2022)   Received from [facility]   Social Connection and Isolation Panel    In a typical week, how many times do you talk on the phone with family, friends, or neighbors?: Patient declined    How often do you get together with friends or relatives?: Patient declined    How often do you attend church or religious services?: Patient declined    Do you belong to any clubs or organizations such as church groups, unions, fraternal or athletic groups, or school groups?: Patient declined    How often do you attend meetings of the clubs or organizations you belong to?: Patient declined    Are you married, widowed, divorced, separated, never married, or living with a partner?: Married  Intimate Partner Violence: Unknown (02/06/2022)   Received from [facility]   Epic    Within the last year, have you been afraid of your partner or ex-partner?: Patient declined    Within the last year, have you been humiliated or emotionally abused in other ways by your partner or ex-partner?: Patient declined    Within the last year, have you been kicked, hit, slapped, or otherwise physically hurt by your partner or ex-partner?: Patient declined    Within the last year, have you been raped or forced to have any kind of sexual activity by your partner or ex-partner?: Patient declined  Depression (EYV7-0): Not on file  Alcohol Screen: Not on file  Housing: Not on file  Utilities: Not on file  Health Literacy: Not on file       PHYSICAL EXAM  Vitals:   01/07/25 1309  Weight: 295 lb 8 oz (134 kg)  Height: 6' 5 (1.956 m)    Body mass index is 35.04  kg/m.   General: The patient is well-developed and well-nourished and in no acute  distress  HEENT:  Head is [place]/AT.  Sclera are anicteric.   Neck:   The neck is nontender and has a reduced range of motion.  ACDF scar noted  Skin: Extremities are without rash.  He has edema in the legs, left much more so than right.  He had not noticed the asymmetric edema  Musculoskeletal: There is pain in the left knee with maneuvers.  Has mild pain over the piriformis muscle on the left.  No pain over trochanteric bursa.  Neurologic Exam  Mental status: The patient is alert and oriented x 3 at the time of the examination. The patient has apparent normal recent and remote memory, with an apparently normal attention span and concentration ability.   Speech is normal.  Cranial nerves: Extraocular movements are full. Pupils are equal, round, and reactive to light and accomodation.  Visual fields are full.  Facial symmetry is present. There is good facial sensation to soft touch bilaterally.Facial strength is normal.  Trapezius and sternocleidomastoid strength is normal. No dysarthria is noted.   No obvious hearing deficits are noted.  Motor:  Muscle bulk is normal.   Tone is normal. Strength is  5 / 5 in all 4 extremities.   Sensory: Sensory testing is intact in the arms and hands.  He has reduced sensation to pinprick from the mid lower leg down with absent pinprick sensation below the ankles.  Vibration sensation was 50% at the ankles and <10% at the toes..  Coordination: Cerebellar testing reveals good finger-nose-finger and heel-to-shin bilaterally.  Gait and station: Station is normal.   Gait is arthritic.Tandem gait is poor.  Romberg is negative.   Reflexes: Deep tendon reflexes are symmetric and normal in the arms 2 at the knees and 2+ at the ankles 1 at ankles..   Plantar responses are flexor.      ASSESSMENT AND PLAN  Polyneuropathy - Plan: Comprehensive metabolic panel with GFR, Thyroid  Panel With TSH  Edema, unspecified type - Plan: Comprehensive metabolic panel with GFR,  Thyroid  Panel With TSH  OSA (obstructive sleep apnea)  Dysesthesia  Gait disturbance  Continue CPAP.  D/L at last visit shows 100% compliance and good efficacy with AHI = 1.9.   Increase lamotrigine  for dysesthesias to 150 mg po tid.  Will continue pregabalin .  He feels he tolerated the pregabalin  better with the gabapentin.  He could not tolerate oxcarbazepine . Stay active and exercise.    At least some of his pain on the left appears to be due to degenerative arthritis.  He will discuss further with primary care.  I recommend left knee Xray.  Left hip xray He has left much greater than right leg edema.  Support hose for left leg.    CMP, TSH for edema eval --- if worsens may need venous U/S Rtc 6 months   Patrisia Faeth A. Vear, MD, [facility] 01/07/2025, 5:21 PM Certified in Neurology, Clinical Neurophysiology, Sleep Medicine and Neuroimaging  [facility] [address], [address] [place], [place] [address] [phone] "

## 2025-01-11 ENCOUNTER — Ambulatory Visit: Payer: Self-pay | Admitting: Neurology

## 2025-01-11 LAB — COMPREHENSIVE METABOLIC PANEL WITH GFR
ALT: 21 IU/L (ref 0–44)
AST: 24 IU/L (ref 0–40)
Albumin: 4.7 g/dL (ref 3.9–4.9)
Alkaline Phosphatase: 133 IU/L — ABNORMAL HIGH (ref 47–123)
BUN/Creatinine Ratio: 13 (ref 10–24)
BUN: 11 mg/dL (ref 8–27)
Bilirubin Total: 0.4 mg/dL (ref 0.0–1.2)
CO2: 22 mmol/L (ref 20–29)
Calcium: 9.8 mg/dL (ref 8.6–10.2)
Chloride: 102 mmol/L (ref 96–106)
Creatinine, Ser: 0.85 mg/dL (ref 0.76–1.27)
Globulin, Total: 2 g/dL (ref 1.5–4.5)
Glucose: 80 mg/dL (ref 70–99)
Potassium: 4.7 mmol/L (ref 3.5–5.2)
Sodium: 141 mmol/L (ref 134–144)
Total Protein: 6.7 g/dL (ref 6.0–8.5)
eGFR: 99 mL/min/1.73

## 2025-01-11 LAB — THYROID PANEL WITH TSH
Free Thyroxine Index: 1.4 (ref 1.2–4.9)
T3 Uptake Ratio: 24 % (ref 24–39)
T4, Total: 5.8 ug/dL (ref 4.5–12.0)
TSH: 4.59 u[IU]/mL — ABNORMAL HIGH (ref 0.450–4.500)

## 2025-01-20 ENCOUNTER — Other Ambulatory Visit: Payer: Self-pay | Admitting: Neurology

## 2025-01-20 MED ORDER — TRAMADOL HCL 50 MG PO TABS: 50.0000 mg | ORAL_TABLET | Freq: Three times a day (TID) | ORAL | 3 refills | Status: AC | PRN

## 2025-01-26 ENCOUNTER — Other Ambulatory Visit: Payer: Self-pay | Admitting: Neurology

## 2025-01-26 DIAGNOSIS — R208 Other disturbances of skin sensation: Secondary | ICD-10-CM

## 2025-01-26 DIAGNOSIS — G629 Polyneuropathy, unspecified: Secondary | ICD-10-CM

## 2025-01-27 ENCOUNTER — Telehealth: Payer: Self-pay | Admitting: Neurology

## 2025-01-27 NOTE — Telephone Encounter (Signed)
 error

## 2025-01-27 NOTE — Telephone Encounter (Signed)
 Referral to Neurology faxed to   [facility]   [facility] Phone: [phone] Fax : [phone]
